# Patient Record
Sex: Female | Born: 1950 | Race: Black or African American | Hispanic: No | Marital: Married | Smoking: Former smoker
Health system: Southern US, Community
[De-identification: ages and names within clinical notes are randomized; demographics above are authoritative.]

## PROBLEM LIST (undated history)

## (undated) DIAGNOSIS — D649 Anemia, unspecified: Secondary | ICD-10-CM

## (undated) DIAGNOSIS — E119 Type 2 diabetes mellitus without complications: Secondary | ICD-10-CM

## (undated) DIAGNOSIS — M1990 Unspecified osteoarthritis, unspecified site: Secondary | ICD-10-CM

## (undated) DIAGNOSIS — N189 Chronic kidney disease, unspecified: Secondary | ICD-10-CM

## (undated) DIAGNOSIS — I10 Essential (primary) hypertension: Secondary | ICD-10-CM

## (undated) DIAGNOSIS — R809 Proteinuria, unspecified: Secondary | ICD-10-CM

## (undated) DIAGNOSIS — Z8711 Personal history of peptic ulcer disease: Secondary | ICD-10-CM

## (undated) DIAGNOSIS — E1110 Type 2 diabetes mellitus with ketoacidosis without coma: Secondary | ICD-10-CM

## (undated) DIAGNOSIS — Z972 Presence of dental prosthetic device (complete) (partial): Secondary | ICD-10-CM

## (undated) DIAGNOSIS — K759 Inflammatory liver disease, unspecified: Secondary | ICD-10-CM

## (undated) HISTORY — PX: TUBAL LIGATION: SHX77

## (undated) HISTORY — PX: DG  BONE DENSITY (ARMC HX): HXRAD1102

## (undated) HISTORY — PX: LAPAROSCOPIC HYSTERECTOMY: SHX1926

## (undated) HISTORY — PX: NEPHROSTOMY W/ INTRODUCTION OF CATHETER: SUR884

---

## 1983-02-14 HISTORY — PX: ABDOMINAL HYSTERECTOMY: SHX81

## 2007-02-14 HISTORY — PX: LIVER BIOPSY: SHX301

## 2007-07-06 ENCOUNTER — Inpatient Hospital Stay: Payer: Self-pay | Admitting: Internal Medicine

## 2007-07-31 ENCOUNTER — Ambulatory Visit: Payer: Self-pay | Admitting: Family Medicine

## 2007-09-12 ENCOUNTER — Ambulatory Visit: Payer: Self-pay | Admitting: Gastroenterology

## 2007-10-29 ENCOUNTER — Ambulatory Visit: Payer: Self-pay | Admitting: Gastroenterology

## 2008-01-16 ENCOUNTER — Ambulatory Visit: Payer: Self-pay | Admitting: Family Medicine

## 2008-05-21 ENCOUNTER — Ambulatory Visit: Payer: Self-pay | Admitting: Family Medicine

## 2008-05-21 DIAGNOSIS — B1920 Unspecified viral hepatitis C without hepatic coma: Secondary | ICD-10-CM | POA: Insufficient documentation

## 2008-05-22 ENCOUNTER — Ambulatory Visit: Payer: Self-pay | Admitting: Family Medicine

## 2009-12-23 DIAGNOSIS — R634 Abnormal weight loss: Secondary | ICD-10-CM | POA: Insufficient documentation

## 2010-06-21 ENCOUNTER — Ambulatory Visit: Payer: Self-pay | Admitting: Otolaryngology

## 2011-01-30 ENCOUNTER — Ambulatory Visit: Payer: Self-pay | Admitting: Gastroenterology

## 2011-02-13 ENCOUNTER — Ambulatory Visit: Payer: Self-pay | Admitting: Gastroenterology

## 2011-02-15 LAB — PATHOLOGY REPORT

## 2011-02-28 ENCOUNTER — Ambulatory Visit: Payer: Self-pay | Admitting: Family Medicine

## 2011-03-06 ENCOUNTER — Ambulatory Visit: Payer: Self-pay | Admitting: Gastroenterology

## 2012-04-26 ENCOUNTER — Emergency Department: Payer: Self-pay | Admitting: Emergency Medicine

## 2012-11-18 DIAGNOSIS — N393 Stress incontinence (female) (male): Secondary | ICD-10-CM | POA: Insufficient documentation

## 2012-11-18 DIAGNOSIS — D179 Benign lipomatous neoplasm, unspecified: Secondary | ICD-10-CM | POA: Insufficient documentation

## 2013-04-15 DIAGNOSIS — N399 Disorder of urinary system, unspecified: Secondary | ICD-10-CM | POA: Insufficient documentation

## 2013-05-26 LAB — LIPID PANEL
Cholesterol: 120 mg/dL (ref 0–200)
HDL: 37 mg/dL (ref 35–70)
LDL CALC: 60 mg/dL
Triglycerides: 116 mg/dL (ref 40–160)

## 2013-05-26 LAB — BASIC METABOLIC PANEL
BUN: 13 mg/dL (ref 4–21)
Creatinine: 0.6 mg/dL (ref 0.5–1.1)
Glucose: 82 mg/dL
Potassium: 4.3 mmol/L (ref 3.4–5.3)
SODIUM: 140 mmol/L (ref 137–147)

## 2013-05-26 LAB — HEPATIC FUNCTION PANEL
ALK PHOS: 50 U/L (ref 25–125)
ALT: 16 U/L (ref 7–35)
AST: 17 U/L (ref 13–35)
BILIRUBIN, TOTAL: 0.3 mg/dL

## 2014-10-15 DEATH — deceased

## 2015-04-06 DIAGNOSIS — R809 Proteinuria, unspecified: Secondary | ICD-10-CM | POA: Diagnosis not present

## 2015-04-06 DIAGNOSIS — E1129 Type 2 diabetes mellitus with other diabetic kidney complication: Secondary | ICD-10-CM | POA: Diagnosis not present

## 2015-04-06 DIAGNOSIS — Z794 Long term (current) use of insulin: Secondary | ICD-10-CM | POA: Diagnosis not present

## 2015-04-13 DIAGNOSIS — Z794 Long term (current) use of insulin: Secondary | ICD-10-CM | POA: Diagnosis not present

## 2015-04-13 DIAGNOSIS — R809 Proteinuria, unspecified: Secondary | ICD-10-CM | POA: Diagnosis not present

## 2015-04-13 DIAGNOSIS — E1129 Type 2 diabetes mellitus with other diabetic kidney complication: Secondary | ICD-10-CM | POA: Diagnosis not present

## 2015-04-13 DIAGNOSIS — E119 Type 2 diabetes mellitus without complications: Secondary | ICD-10-CM | POA: Insufficient documentation

## 2015-04-21 DIAGNOSIS — N2889 Other specified disorders of kidney and ureter: Secondary | ICD-10-CM | POA: Insufficient documentation

## 2015-04-21 DIAGNOSIS — E559 Vitamin D deficiency, unspecified: Secondary | ICD-10-CM | POA: Insufficient documentation

## 2015-04-21 DIAGNOSIS — I10 Essential (primary) hypertension: Secondary | ICD-10-CM | POA: Insufficient documentation

## 2015-04-21 DIAGNOSIS — K635 Polyp of colon: Secondary | ICD-10-CM | POA: Insufficient documentation

## 2015-04-21 DIAGNOSIS — R160 Hepatomegaly, not elsewhere classified: Secondary | ICD-10-CM | POA: Insufficient documentation

## 2015-04-23 ENCOUNTER — Ambulatory Visit (INDEPENDENT_AMBULATORY_CARE_PROVIDER_SITE_OTHER): Payer: Medicare Other | Admitting: Physician Assistant

## 2015-04-23 ENCOUNTER — Encounter: Payer: Self-pay | Admitting: Physician Assistant

## 2015-04-23 VITALS — BP 126/78 | HR 76 | Temp 98.1°F | Resp 18 | Ht 68.0 in | Wt 195.0 lb

## 2015-04-23 DIAGNOSIS — Z23 Encounter for immunization: Secondary | ICD-10-CM

## 2015-04-23 DIAGNOSIS — Z0000 Encounter for general adult medical examination without abnormal findings: Secondary | ICD-10-CM | POA: Diagnosis not present

## 2015-04-23 DIAGNOSIS — Z1239 Encounter for other screening for malignant neoplasm of breast: Secondary | ICD-10-CM | POA: Diagnosis not present

## 2015-04-23 NOTE — Patient Instructions (Signed)

## 2015-04-23 NOTE — Progress Notes (Signed)
Patient: Julie [name], Female    DOB: [birth year], 65 y.o.   MRN: [id number] Visit Date: 04/23/2015  Today's Provider: Mar Daring, PA-C   Chief Complaint  Patient presents with  . Medicare Wellness   Subjective:    Annual wellness visit Julie [name] is a 65 y.o. female. She feels well. She reports exercising not really any physical exercise-she stays active and tries to walk. She reports she is sleeping well. She followed by [facility] for her Diabetes Type 2. LOV was 04/20/14 HgbA1C was 8.0. Was advised to continue Levemir,Metformin, Glipizide, Farxiga and Januvia 100 mg daily was added. She was to follow up with them in three months-last visit with Dr. Gabriel Carina was in February 2017.  Immunization History  Administered Date(s) Administered  . Hepatitis B 10/26/2008, 11/25/2008, 11/30/2010  . Pneumococcal Polysaccharide-23 11/19/2007  . Tdap 11/30/2010   Colonoscopy: 02/13/11-Single polyp. Pathology report recommend repeat colonoscopy in 5 years.  This was down by Dr. Gustavo Lah. Echo: 08/2008 Heart Murmur Mild MR, Mild TR, Normal EF 60%. Urology Consult. 11/28/12 Dr. Bernardo Heater. Right renal angiomyolipoma. Ct repeated on 11/10/2013 showed stable size at less than 4 cm. Follow up renal Ultrasound in 1 yr.  She is followed by Dr. Bernardo Heater and sees him in 05/2015. Mammogram:2013 patient is not sure but has not had one in the past 2 years. Pap smear-she is not sure how long but she had complete hysterectomy, she has no female parts left.   Review of Systems  Constitutional: Negative.   HENT: Negative.   Eyes: Negative.   Respiratory: Negative.   Cardiovascular: Negative.   Gastrointestinal: Negative.   Endocrine: Negative.   Genitourinary: Negative.   Musculoskeletal: Negative.   Skin: Negative.   Allergic/Immunologic: Negative.   Neurological: Negative.   Hematological: Negative.   Psychiatric/Behavioral: Negative.     Social History   Social History  . Marital  Status: Legally Separated    Spouse Name: N/A  . Number of Children: N/A  . Years of Education: N/A   Occupational History  . Not on file.   Social History Main Topics  . Smoking status: Former Smoker -- 0.50 packs/day for 20 years    Types: Cigarettes    Quit date: 02/13/2005  . Smokeless tobacco: Never Used  . Alcohol Use: No  . Drug Use: No  . Sexual Activity: No   Other Topics Concern  . Not on file   Social History Narrative    No past medical history on file.   Patient Active Problem List   Diagnosis Date Noted  . Colon polyp 04/21/2015  . Benign essential HTN 04/21/2015  . Liver mass 04/21/2015  . Kidney lump 04/21/2015  . Avitaminosis D 04/21/2015  . Type 2 diabetes mellitus ([place]) 04/13/2015  . Urinary system disease 04/15/2013  . Fatty tumor 11/18/2012  . Abnormal loss of weight 12/23/2009  . Pure hypercholesterolemia 01/26/2009  . Hepatitis C virus infection without hepatic coma 05/21/2008  . Diabetes mellitus ([place]) 07/12/2007  . Abnormal ECG 02/09/2007  . H/O peptic ulcer 02/09/2007  . Tobacco use 02/01/2007    Past Surgical History  Procedure Laterality Date  . Liver biopsy  2009    + hepatitis C, referred to [facility] by Gustavo Lah 04/2008.  . Laparoscopic hysterectomy      assisted vaginal hysterectomy, fibroids, ovaries removed    Her family history includes Alcohol abuse in her father; Arthritis in her mother; Cancer in her maternal aunt; Cirrhosis in her father;  Diabetes in her son; Stroke in her father.    Previous Medications   BLOOD GLUCOSE MONITORING SUPPL (ONETOUCH VERIO) W/DEVICE KIT       DAPAGLIFLOZIN PROPANEDIOL (FARXIGA) 10 MG TABS TABLET    Take by mouth.   ENALAPRIL (VASOTEC) 20 MG TABLET    Take by mouth.   GLIPIZIDE (GLUCOTROL XL) 10 MG 24 HR TABLET    Take by mouth.   GLUCOSE BLOOD (ONETOUCH VERIO) TEST STRIP       INSULIN DETEMIR (LEVEMIR FLEXPEN) 100 UNIT/ML PEN    Inject 48 Units into the skin at bedtime.    INSULIN PEN NEEDLE  (BD PEN NEEDLE NANO U/F) 32G X 4 MM MISC       JANUVIA 100 MG TABLET       LOVASTATIN (MEVACOR) 40 MG TABLET    Take by mouth. Reported on 04/23/2015   METFORMIN (GLUCOPHAGE) 500 MG TABLET    Take by mouth.    Patient Care Team: Mar Daring, PA-C as PCP - General (Family Medicine)     Objective:   Vitals: BP 126/78 mmHg  Pulse 76  Temp(Src) 98.1 F (36.7 C)  Resp 18  Ht '5\' 8"'  (1.727 m)  Wt 195 lb (88.451 kg)  BMI 29.66 kg/m2  Physical Exam  Constitutional: She is oriented to person, place, and time. She appears well-developed and well-nourished. No distress.  HENT:  Head: Normocephalic and atraumatic.  Right Ear: Hearing, tympanic membrane, external ear and ear canal normal.  Left Ear: Hearing, tympanic membrane, external ear and ear canal normal.  Nose: Nose normal.  Mouth/Throat: Uvula is midline, oropharynx is clear and moist and mucous membranes are normal. No oropharyngeal exudate, posterior oropharyngeal edema or posterior oropharyngeal erythema.  Eyes: Conjunctivae and EOM are normal. Pupils are equal, round, and reactive to light. Right eye exhibits no discharge. Left eye exhibits no discharge. No scleral icterus.  Neck: Normal range of motion. Neck supple. No JVD present. No tracheal deviation present. No thyromegaly present.  Cardiovascular: Normal rate, regular rhythm, normal heart sounds, intact distal pulses and normal pulses.  Exam reveals no gallop and no friction rub.   No murmur heard. Pulses:      Dorsalis pedis pulses are 2+ on the right side, and 2+ on the left side.  Pulmonary/Chest: Effort normal and breath sounds normal. No respiratory distress. She has no wheezes. She has no rales. She exhibits no tenderness.  Abdominal: Soft. Bowel sounds are normal. She exhibits no distension and no mass. There is no tenderness. There is no rebound and no guarding.  Genitourinary:  Deferred by patient  Musculoskeletal: Normal range of motion. She exhibits no  edema or tenderness.  Lymphadenopathy:    She has no cervical adenopathy.  Neurological: She is alert and oriented to person, place, and time. No cranial nerve deficit. Coordination normal.  Skin: Skin is warm and dry. No rash noted. She is not diaphoretic.  Psychiatric: She has a normal mood and affect. Her behavior is normal. Judgment and thought content normal.  Vitals reviewed.   Activities of Daily Living In your present state of health, do you have any difficulty performing the following activities: 04/23/2015  Hearing? N  Vision? N  Difficulty concentrating or making decisions? N  Walking or climbing stairs? N  Dressing or bathing? N  Doing errands, shopping? N    Fall Risk Assessment Fall Risk  04/23/2015  Falls in the past year? No     Depression Screen PHQ 2/9  Scores 04/23/2015  PHQ - 2 Score 0    Cognitive Testing - 6-CIT  Correct? Score   What year is it? yes 0 0 or 4  What month is it? yes 0 0 or 3  Memorize:    Pia Mau,  41,  [place],      What time is it? (within 1 hour) yes 0 0 or 3  Count backwards from 20 yes 0 0, 2, or 4  Name the months of the year yes 0 0, 2, or 4  Repeat name & address above no 3 0, 2, 4, 6, 8, or 10       TOTAL SCORE  3/28   Interpretation:  Normal  Normal (0-7) Abnormal (8-28)   Audit-C Alcohol Use Screening  Question Answer Points  How often do you have alcoholic drink? never 0  On days you do drink alcohol, how many drinks do you typically consume? 0 0  How oftey will you drink 6 or more in a total? never 0  Total Score:  0   A score of 3 or more in women, and 4 or more in men indicates increased risk for alcohol abuse, EXCEPT if all of the points are from question 1.   Hearing Screening   '125Hz'  '250Hz'  '500Hz'  '1000Hz'  '2000Hz'  '4000Hz'  '8000Hz'   Right ear:   '20 25 20 25   ' Left ear:   '20 25 20 25         ' Assessment & Plan:     Annual Wellness Visit  Reviewed patient's Family Medical History Reviewed and  updated list of patient's medical providers Assessment of cognitive impairment was done Assessed patient's functional ability Established a written schedule for health screening [place] Completed and Reviewed   Discussed health benefits of physical activity, and encouraged her to engage in regular exercise appropriate for her age and condition.    ------------------------------------------------------------------------------------------------

## 2015-05-21 ENCOUNTER — Other Ambulatory Visit: Payer: Self-pay | Admitting: Physician Assistant

## 2015-05-21 ENCOUNTER — Ambulatory Visit
Admission: RE | Admit: 2015-05-21 | Discharge: 2015-05-21 | Disposition: A | Discharge status: Home / Self Care | Payer: Medicare Other | Source: Ambulatory Visit | Attending: Physician Assistant | Admitting: Physician Assistant

## 2015-05-21 DIAGNOSIS — Z1239 Encounter for other screening for malignant neoplasm of breast: Secondary | ICD-10-CM

## 2015-05-21 DIAGNOSIS — Z1231 Encounter for screening mammogram for malignant neoplasm of breast: Secondary | ICD-10-CM | POA: Diagnosis present

## 2015-05-24 DIAGNOSIS — D179 Benign lipomatous neoplasm, unspecified: Secondary | ICD-10-CM | POA: Diagnosis not present

## 2015-05-24 DIAGNOSIS — Z6832 Body mass index (BMI) 32.0-32.9, adult: Secondary | ICD-10-CM | POA: Diagnosis not present

## 2015-05-24 DIAGNOSIS — N2889 Other specified disorders of kidney and ureter: Secondary | ICD-10-CM | POA: Diagnosis not present

## 2015-05-25 ENCOUNTER — Other Ambulatory Visit: Payer: Self-pay | Admitting: Physician Assistant

## 2015-05-25 DIAGNOSIS — R928 Other abnormal and inconclusive findings on diagnostic imaging of breast: Secondary | ICD-10-CM

## 2015-05-27 DIAGNOSIS — N281 Cyst of kidney, acquired: Secondary | ICD-10-CM | POA: Diagnosis not present

## 2015-05-27 DIAGNOSIS — D1771 Benign lipomatous neoplasm of kidney: Secondary | ICD-10-CM | POA: Diagnosis not present

## 2015-06-11 ENCOUNTER — Telehealth: Payer: Self-pay

## 2015-06-11 ENCOUNTER — Ambulatory Visit
Admission: RE | Admit: 2015-06-11 | Discharge: 2015-06-11 | Disposition: A | Discharge status: Home / Self Care | Payer: Medicare Other | Source: Ambulatory Visit | Attending: Physician Assistant | Admitting: Physician Assistant

## 2015-06-11 DIAGNOSIS — R928 Other abnormal and inconclusive findings on diagnostic imaging of breast: Secondary | ICD-10-CM | POA: Diagnosis present

## 2015-06-11 DIAGNOSIS — N6489 Other specified disorders of breast: Secondary | ICD-10-CM | POA: Diagnosis not present

## 2015-06-11 NOTE — Telephone Encounter (Signed)
-----   Message from Mar Daring, [place] sent at 06/11/2015 10:38 AM EDT ----- Mass still noted in left breast that is believed to be benign but recommend 6 month repeat imaging of left breast.

## 2015-06-11 NOTE — Telephone Encounter (Signed)
Patient advised as directed below.  Thanks,  -Joseline 

## 2015-06-11 NOTE — Telephone Encounter (Signed)
LMTCB  Thanks,  -Collene Massimino 

## 2015-06-11 NOTE — Telephone Encounter (Signed)
Pt is returning call.  CB#[phone]/MW

## 2015-08-04 DIAGNOSIS — R809 Proteinuria, unspecified: Secondary | ICD-10-CM | POA: Diagnosis not present

## 2015-08-04 DIAGNOSIS — E1129 Type 2 diabetes mellitus with other diabetic kidney complication: Secondary | ICD-10-CM | POA: Diagnosis not present

## 2015-08-04 DIAGNOSIS — Z794 Long term (current) use of insulin: Secondary | ICD-10-CM | POA: Diagnosis not present

## 2015-08-04 LAB — HEPATIC FUNCTION PANEL
ALK PHOS: 41 U/L (ref 25–125)
ALT: 11 U/L (ref 7–35)
AST: 15 U/L (ref 13–35)

## 2015-08-11 DIAGNOSIS — E1129 Type 2 diabetes mellitus with other diabetic kidney complication: Secondary | ICD-10-CM | POA: Diagnosis not present

## 2015-08-11 DIAGNOSIS — Z794 Long term (current) use of insulin: Secondary | ICD-10-CM | POA: Diagnosis not present

## 2015-08-11 DIAGNOSIS — R809 Proteinuria, unspecified: Secondary | ICD-10-CM | POA: Diagnosis not present

## 2015-12-06 DIAGNOSIS — Z794 Long term (current) use of insulin: Secondary | ICD-10-CM | POA: Diagnosis not present

## 2015-12-06 DIAGNOSIS — R809 Proteinuria, unspecified: Secondary | ICD-10-CM | POA: Diagnosis not present

## 2015-12-06 DIAGNOSIS — E1129 Type 2 diabetes mellitus with other diabetic kidney complication: Secondary | ICD-10-CM | POA: Diagnosis not present

## 2015-12-06 LAB — BASIC METABOLIC PANEL
BUN: 16 mg/dL (ref 4–21)
CREATININE: 0.6 mg/dL (ref 0.5–1.1)
Glucose: 104 mg/dL
POTASSIUM: 4.1 mmol/L (ref 3.4–5.3)
SODIUM: 141 mmol/L (ref 137–147)

## 2015-12-06 LAB — LIPID PANEL
Cholesterol: 164 mg/dL (ref 0–200)
HDL: 36 mg/dL (ref 35–70)
LDL Cholesterol: 103 mg/dL
Triglycerides: 124 mg/dL (ref 40–160)

## 2015-12-13 DIAGNOSIS — R809 Proteinuria, unspecified: Secondary | ICD-10-CM | POA: Diagnosis not present

## 2015-12-13 DIAGNOSIS — Z794 Long term (current) use of insulin: Secondary | ICD-10-CM | POA: Diagnosis not present

## 2015-12-13 DIAGNOSIS — E1129 Type 2 diabetes mellitus with other diabetic kidney complication: Secondary | ICD-10-CM | POA: Diagnosis not present

## 2015-12-24 ENCOUNTER — Telehealth: Payer: Self-pay | Admitting: Physician Assistant

## 2015-12-24 DIAGNOSIS — R928 Other abnormal and inconclusive findings on diagnostic imaging of breast: Secondary | ICD-10-CM

## 2015-12-24 NOTE — Telephone Encounter (Signed)
Please Review.  Thanks,  -Caid Radin 

## 2015-12-24 NOTE — Telephone Encounter (Signed)
Mammogram ordered

## 2015-12-24 NOTE — Telephone Encounter (Signed)
Pt states she is needing an appointment with [facility] to have a 6 month follow up.  Pt states we will need to send an order to [facility] so she can have this scheduled.  CB#[phone]/MW

## 2015-12-24 NOTE — Telephone Encounter (Signed)
Patient has been advised. KW 

## 2016-01-25 ENCOUNTER — Telehealth: Payer: Self-pay | Admitting: Physician Assistant

## 2016-01-25 DIAGNOSIS — R928 Other abnormal and inconclusive findings on diagnostic imaging of breast: Secondary | ICD-10-CM

## 2016-01-25 NOTE — Telephone Encounter (Signed)
Pt stated that it is time for 6 month Dx mammogram and she called [facility] and was advised that [name] would need to send a referral/order for the [place]. Please advise. Thanks TNP

## 2016-01-25 NOTE — Telephone Encounter (Signed)
Please review.  Thanks,  -Joseline 

## 2016-01-25 NOTE — Telephone Encounter (Signed)
Diagnostic mammogram and left breast US ordered

## 2016-02-18 ENCOUNTER — Ambulatory Visit
Admission: RE | Admit: 2016-02-18 | Discharge: 2016-02-18 | Disposition: A | Discharge status: Home / Self Care | Payer: Medicare Other | Source: Ambulatory Visit | Attending: Physician Assistant | Admitting: Physician Assistant

## 2016-02-18 ENCOUNTER — Other Ambulatory Visit: Payer: Self-pay | Admitting: Physician Assistant

## 2016-02-18 ENCOUNTER — Telehealth: Payer: Self-pay

## 2016-02-18 DIAGNOSIS — R928 Other abnormal and inconclusive findings on diagnostic imaging of breast: Secondary | ICD-10-CM | POA: Diagnosis present

## 2016-02-18 DIAGNOSIS — N6321 Unspecified lump in the left breast, upper outer quadrant: Secondary | ICD-10-CM | POA: Insufficient documentation

## 2016-02-18 NOTE — Telephone Encounter (Signed)
-----   Message from Mar Daring, [place] sent at 02/18/2016 10:55 AM EST ----- Left breast mass stable, seemingly benign and plan for repeat diagnostic mammogram and [place] in 05/2016

## 2016-02-18 NOTE — Telephone Encounter (Signed)
Pt advised and agrees with treatment plan. Emily Drozdowski, CMA  

## 2016-04-07 DIAGNOSIS — Z86010 Personal history of colonic polyps: Secondary | ICD-10-CM | POA: Diagnosis not present

## 2016-04-07 DIAGNOSIS — Z794 Long term (current) use of insulin: Secondary | ICD-10-CM | POA: Diagnosis not present

## 2016-04-07 DIAGNOSIS — E1129 Type 2 diabetes mellitus with other diabetic kidney complication: Secondary | ICD-10-CM | POA: Diagnosis not present

## 2016-04-07 DIAGNOSIS — R809 Proteinuria, unspecified: Secondary | ICD-10-CM | POA: Diagnosis not present

## 2016-04-07 LAB — HEMOGLOBIN A1C: Hemoglobin A1C: 7

## 2016-04-14 DIAGNOSIS — R809 Proteinuria, unspecified: Secondary | ICD-10-CM | POA: Diagnosis not present

## 2016-04-14 DIAGNOSIS — Z794 Long term (current) use of insulin: Secondary | ICD-10-CM | POA: Diagnosis not present

## 2016-04-14 DIAGNOSIS — E1129 Type 2 diabetes mellitus with other diabetic kidney complication: Secondary | ICD-10-CM | POA: Diagnosis not present

## 2016-07-13 ENCOUNTER — Ambulatory Visit (INDEPENDENT_AMBULATORY_CARE_PROVIDER_SITE_OTHER): Payer: Medicare Other | Admitting: Physician Assistant

## 2016-07-13 ENCOUNTER — Ambulatory Visit
Admission: RE | Admit: 2016-07-13 | Discharge: 2016-07-13 | Disposition: A | Discharge status: Home / Self Care | Payer: Medicare Other | Source: Ambulatory Visit | Attending: Physician Assistant | Admitting: Physician Assistant

## 2016-07-13 ENCOUNTER — Encounter: Payer: Self-pay | Admitting: Physician Assistant

## 2016-07-13 ENCOUNTER — Telehealth: Payer: Self-pay

## 2016-07-13 VITALS — BP 106/40 | HR 84 | Temp 97.8°F | Resp 16 | Ht 65.0 in | Wt 198.0 lb

## 2016-07-13 DIAGNOSIS — M25511 Pain in right shoulder: Secondary | ICD-10-CM | POA: Diagnosis not present

## 2016-07-13 DIAGNOSIS — M19011 Primary osteoarthritis, right shoulder: Secondary | ICD-10-CM | POA: Diagnosis not present

## 2016-07-13 DIAGNOSIS — M7581 Other shoulder lesions, right shoulder: Secondary | ICD-10-CM

## 2016-07-13 DIAGNOSIS — R928 Other abnormal and inconclusive findings on diagnostic imaging of breast: Secondary | ICD-10-CM

## 2016-07-13 DIAGNOSIS — M7989 Other specified soft tissue disorders: Secondary | ICD-10-CM

## 2016-07-13 DIAGNOSIS — R2232 Localized swelling, mass and lump, left upper limb: Secondary | ICD-10-CM

## 2016-07-13 MED ORDER — NAPROXEN 500 MG PO TABS: 500.0000 mg | ORAL_TABLET | Freq: Two times a day (BID) | ORAL | 0 refills | Status: DC

## 2016-07-13 NOTE — Addendum Note (Signed)
Addended by: Mar Daring on: 07/13/2016 03:16 PM   Modules accepted: Orders

## 2016-07-13 NOTE — Telephone Encounter (Signed)
LMTCB  Thanks,  -Joseline 

## 2016-07-13 NOTE — Telephone Encounter (Signed)
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

## 2016-07-13 NOTE — Progress Notes (Signed)
Patient: Julie [name] Female    DOB: 1950/06/03   66 y.o.   MRN: [id number] Visit Date: 07/13/2016  Today's Provider: Mar Daring, PA-C   Chief Complaint  Patient presents with  . Shoulder Pain  . Adenopathy   Subjective:    Shoulder Pain   The pain is present in the right shoulder. This is a new problem. Episode onset: x 3 months. The problem has been unchanged. Quality: "dull, nagging" The pain is at a severity of 4/10. The pain is moderate. Associated symptoms include a limited range of motion. Pertinent negatives include no fever, inability to bear weight, itching, joint locking, joint swelling, numbness, stiffness or tingling. Exacerbated by: movement. She has tried NSAIDS (topical ointment) for the symptoms. The treatment provided significant relief.    Pt also notes left axillary mass. This has been for several years (approx 10 per patient), but has been worsening, becoming more tender and enlarging.    Pt also needs a referral for bilateral diagnostic mammogram and left breast ultrasound (which was due in April 2018) for a FU of a BI-RADS 3 for left breast abnormality mammogram on 02/18/2016.   No Known Allergies   Current Outpatient Prescriptions:  .  Blood Glucose Monitoring Suppl (ONETOUCH VERIO) w/Device KIT, , Disp: , Rfl:  .  enalapril (VASOTEC) 20 MG tablet, Take 40 mg by mouth daily. , Disp: , Rfl:  .  glipiZIDE (GLUCOTROL XL) 10 MG 24 hr tablet, Take 10 mg by mouth daily with breakfast. , Disp: , Rfl:  .  glucose blood (ONETOUCH VERIO) test strip, , Disp: , Rfl:  .  Insulin Detemir (LEVEMIR FLEXPEN) 100 UNIT/ML Pen, Inject 48 Units into the skin at bedtime. , Disp: , Rfl:  .  Insulin Pen Needle (BD PEN NEEDLE NANO U/F) 32G X 4 MM MISC, , Disp: , Rfl:  .  JANUVIA 100 MG tablet, , Disp: , Rfl:  .  metFORMIN (GLUCOPHAGE) 500 MG tablet, Take by mouth 2 (two) times daily with a meal. , Disp: , Rfl:   Review of Systems  Constitutional: Negative for  activity change, appetite change, chills, diaphoresis, fatigue, fever and unexpected weight change.  Respiratory: Negative for cough and shortness of breath.   Cardiovascular: Negative for chest pain, palpitations and leg swelling.  Musculoskeletal: Positive for arthralgias. Negative for joint swelling, neck pain, neck stiffness and stiffness.  Skin: Negative for itching.  Neurological: Negative for tingling, weakness and numbness.  Hematological: Positive for adenopathy (mass left armpit).    Social History  Substance Use Topics  . Smoking status: Former Smoker    Packs/day: 0.50    Years: 20.00    Types: Cigarettes    Quit date: 02/13/2005  . Smokeless tobacco: Never Used  . Alcohol use No   Objective:   BP (!) 106/40 (BP Location: Left Arm, Patient Position: Sitting, Cuff Size: Large)   Pulse 84   Temp 97.8 F (36.6 C) (Oral)   Resp 16   Ht 5' 5" (1.651 m)   Wt 198 lb (89.8 kg)   BMI 32.95 kg/m  Vitals:   07/13/16 1044  BP: (!) 106/40  Pulse: 84  Resp: 16  Temp: 97.8 F (36.6 C)  TempSrc: Oral  Weight: 198 lb (89.8 kg)  Height: 5' 5" (1.651 m)     Physical Exam  Constitutional: She appears well-developed and well-nourished. No distress.  Neck: Normal range of motion. Neck supple. No JVD present. No spinous process  tenderness and no muscular tenderness present. No tracheal deviation and normal range of motion present. No thyromegaly present.  Cardiovascular: Normal rate, regular rhythm and normal heart sounds.  Exam reveals no gallop and no friction rub.   No murmur heard. Pulmonary/Chest: Effort normal and breath sounds normal. No respiratory distress. She has no wheezes. She has no rales. She exhibits mass.    Musculoskeletal:       Right shoulder: She exhibits decreased range of motion (IR and ER at 90 degree abd, and IR with arm at side). She exhibits no tenderness, no bony tenderness, no swelling, no crepitus, no deformity, no pain, no spasm, normal pulse and  normal strength.  + Apley scratch, negative Hawkin's impingement, negative empty can  Lymphadenopathy:    She has no cervical adenopathy.  Skin: She is not diaphoretic.  Vitals reviewed.      Assessment & Plan:     1. Rotator cuff tendinitis, right Suspect a rotator cuff tendinopathy vs arthritis (or both). Will get imaging as below and f/u pending results. May continue topical creams. Heat as needed. Exercises provided via AVS. Will give naproxen as below for longer and more consistent trial of NSAIDs. Will avoid steroids if possible due to T2DM. Will f/u pending results.  - naproxen (NAPROSYN) 500 MG tablet; Take 1 tablet (500 mg total) by mouth 2 (two) times daily with a meal.  Dispense: 30 tablet; Refill: 0 - DG Shoulder Right; Future  2. Acute pain of right shoulder See above medical treatment plan. - DG Shoulder Right; Future  3. Abnormal mammogram of left breast Abnormal diagnostic mammogram of left breast. Repeat imaging ordered. - MM DIAG BREAST TOMO BILATERAL; Future - US BREAST COMPLETE UNI LEFT INC AXILLA; Future  4. Mass of soft tissue of left upper extremity Suspect lipoma, but US ordered as below to further examine. Will f/u pending results.  - US AXILLA LEFT; Future       Jennifer M Burnette, PA-C  [facility] 

## 2016-07-13 NOTE — Patient Instructions (Signed)

## 2016-07-14 NOTE — Telephone Encounter (Signed)
Pt advised-aa 

## 2016-07-20 ENCOUNTER — Emergency Department
Admission: EM | Admit: 2016-07-20 | Discharge: 2016-07-20 | Disposition: A | Discharge status: Home / Self Care | Payer: Medicare Other | Attending: Emergency Medicine | Admitting: Emergency Medicine

## 2016-07-20 ENCOUNTER — Encounter: Payer: Self-pay | Admitting: Emergency Medicine

## 2016-07-20 DIAGNOSIS — Z87891 Personal history of nicotine dependence: Secondary | ICD-10-CM | POA: Diagnosis not present

## 2016-07-20 DIAGNOSIS — Z7984 Long term (current) use of oral hypoglycemic drugs: Secondary | ICD-10-CM | POA: Diagnosis not present

## 2016-07-20 DIAGNOSIS — E119 Type 2 diabetes mellitus without complications: Secondary | ICD-10-CM | POA: Diagnosis not present

## 2016-07-20 DIAGNOSIS — N12 Tubulo-interstitial nephritis, not specified as acute or chronic: Secondary | ICD-10-CM

## 2016-07-20 DIAGNOSIS — Z794 Long term (current) use of insulin: Secondary | ICD-10-CM | POA: Diagnosis not present

## 2016-07-20 DIAGNOSIS — N10 Acute tubulo-interstitial nephritis: Secondary | ICD-10-CM | POA: Insufficient documentation

## 2016-07-20 DIAGNOSIS — I10 Essential (primary) hypertension: Secondary | ICD-10-CM | POA: Insufficient documentation

## 2016-07-20 DIAGNOSIS — Z79899 Other long term (current) drug therapy: Secondary | ICD-10-CM | POA: Diagnosis not present

## 2016-07-20 DIAGNOSIS — R1084 Generalized abdominal pain: Secondary | ICD-10-CM | POA: Diagnosis present

## 2016-07-20 LAB — CBC
HCT: 30.2 % — ABNORMAL LOW (ref 35.0–47.0)
Hemoglobin: 10 g/dL — ABNORMAL LOW (ref 12.0–16.0)
MCH: 29 pg (ref 26.0–34.0)
MCHC: 33.2 g/dL (ref 32.0–36.0)
MCV: 87.4 fL (ref 80.0–100.0)
PLATELETS: 138 10*3/uL — AB (ref 150–440)
RBC: 3.46 MIL/uL — AB (ref 3.80–5.20)
RDW: 13.4 % (ref 11.5–14.5)
WBC: 12 10*3/uL — ABNORMAL HIGH (ref 3.6–11.0)

## 2016-07-20 LAB — URINALYSIS, COMPLETE (UACMP) WITH MICROSCOPIC
Bilirubin Urine: NEGATIVE
Glucose, UA: NEGATIVE mg/dL
Hgb urine dipstick: NEGATIVE
KETONES UR: NEGATIVE mg/dL
Nitrite: POSITIVE — AB
PH: 6 (ref 5.0–8.0)
Protein, ur: NEGATIVE mg/dL
Specific Gravity, Urine: 1.012 (ref 1.005–1.030)

## 2016-07-20 LAB — COMPREHENSIVE METABOLIC PANEL
ALBUMIN: 4 g/dL (ref 3.5–5.0)
ALT: 15 U/L (ref 14–54)
AST: 20 U/L (ref 15–41)
Alkaline Phosphatase: 49 U/L (ref 38–126)
Anion gap: 9 (ref 5–15)
BUN: 22 mg/dL — AB (ref 6–20)
CHLORIDE: 102 mmol/L (ref 101–111)
CO2: 25 mmol/L (ref 22–32)
CREATININE: 0.72 mg/dL (ref 0.44–1.00)
Calcium: 9.3 mg/dL (ref 8.9–10.3)
GFR calc Af Amer: >60 mL/min (ref >60)
GFR calc non Af Amer: >60 mL/min (ref >60)
Glucose, Bld: 155 mg/dL — ABNORMAL HIGH (ref 65–99)
Potassium: 4.3 mmol/L (ref 3.5–5.1)
SODIUM: 136 mmol/L (ref 135–145)
Total Bilirubin: 0.7 mg/dL (ref 0.3–1.2)
Total Protein: 7.7 g/dL (ref 6.5–8.1)

## 2016-07-20 LAB — LIPASE, BLOOD: LIPASE: 61 U/L — AB (ref 11–51)

## 2016-07-20 MED ORDER — ONDANSETRON HCL 4 MG PO TABS: 4.0000 mg | ORAL_TABLET | Freq: Three times a day (TID) | ORAL | 0 refills | Status: DC | PRN

## 2016-07-20 MED ORDER — DEXTROSE 5 % IV SOLN
1.0000 g | Freq: Once | INTRAVENOUS | Status: AC
  Administered 2016-07-20: 1 g via INTRAVENOUS
  Filled 2016-07-20: qty 10

## 2016-07-20 MED ORDER — CEPHALEXIN 500 MG PO CAPS
500.0000 mg | ORAL_CAPSULE | Freq: Three times a day (TID) | ORAL | 0 refills | Status: AC
Start: 2016-07-20 — End: 2016-07-30

## 2016-07-20 MED ORDER — SODIUM CHLORIDE 0.9 % IV BOLUS (SEPSIS)
1000.0000 mL | Freq: Once | INTRAVENOUS | Status: AC
  Administered 2016-07-20: 1000 mL via INTRAVENOUS

## 2016-07-20 NOTE — ED Provider Notes (Addendum)
[facility] Emergency Department Provider Note  ____________________________________________   I have reviewed the triage vital signs and the nursing notes.   HISTORY  Chief Complaint Flank Pain and Abdominal Pain    HPI Julie [name] is a 66 y.o. female who states she's been having urinary frequency, urgency, foul smelling urine which is cloudy for the last 2-3 days and now she is beginning to have some pain on the left flank. She denies vomiting or fever. No other associated symptoms no prior treatment, nothing makes it better nothing makes it worse. Pain is a mild cramping discomfort.      History reviewed. No pertinent past medical history.  Patient Active Problem List   Diagnosis Date Noted  . Colon polyp 04/21/2015  . Benign essential HTN 04/21/2015  . Liver mass 04/21/2015  . Kidney lump 04/21/2015  . Avitaminosis D 04/21/2015  . Type 2 diabetes mellitus ([place]) 04/13/2015  . Urinary system disease 04/15/2013  . Fatty tumor 11/18/2012  . Abnormal loss of weight 12/23/2009  . Pure hypercholesterolemia 01/26/2009  . Hepatitis C virus infection without hepatic coma 05/21/2008  . Diabetes mellitus ([place]) 07/12/2007  . Abnormal ECG 02/09/2007  . H/O peptic ulcer 02/09/2007  . Tobacco use 02/01/2007    Past Surgical History:  Procedure Laterality Date  . LAPAROSCOPIC HYSTERECTOMY     assisted vaginal hysterectomy, fibroids, ovaries removed  . LIVER BIOPSY  2009   + hepatitis C, referred to [facility] by Gustavo Lah 04/2008.    Prior to Admission medications   Medication Sig Start Date End Date Taking? Authorizing Provider  Blood Glucose Monitoring Suppl (ONETOUCH VERIO) w/Device KIT  04/13/15   [provider]  enalapril (VASOTEC) 20 MG tablet Take 40 mg by mouth daily.  07/15/13   [provider]  glipiZIDE (GLUCOTROL XL) 10 MG 24 hr tablet Take 10 mg by mouth daily with breakfast.  01/03/12   [provider]  glucose blood  (ONETOUCH VERIO) test strip  06/04/12   [provider]  Insulin Detemir (LEVEMIR FLEXPEN) 100 UNIT/ML Pen Inject 48 Units into the skin at bedtime.     [provider]  Insulin Pen Needle (BD PEN NEEDLE NANO U/F) 32G X 4 MM MISC  05/28/14   [provider]  JANUVIA 100 MG tablet  02/16/15   [provider]  metFORMIN (GLUCOPHAGE) 500 MG tablet Take by mouth 2 (two) times daily with a meal.  11/16/11   [provider]  naproxen (NAPROSYN) 500 MG tablet Take 1 tablet (500 mg total) by mouth 2 (two) times daily with a meal. 07/13/16   Mar Daring, PA-C    Allergies Patient has no known allergies.  Family History  Problem Relation Age of Onset  . Arthritis Mother   . Hypertension Mother   . Stroke Father   . Alcohol abuse Father   . Cirrhosis Father   . Cancer Maternal Aunt   . Diabetes Son   . Pancreatic disease Brother   . Healthy Brother   . Healthy Brother   . Breast cancer Neg Hx     Social History Social History  Substance Use Topics  . Smoking status: Former Smoker    Packs/day: 0.50    Years: 20.00    Types: Cigarettes    Quit date: 02/13/2005  . Smokeless tobacco: Never Used  . Alcohol use No    Review of Systems Constitutional: No fever/chills Eyes: No visual changes. ENT: No sore throat. No stiff  neck no neck pain Cardiovascular: Denies chest pain. Respiratory: Denies shortness of breath. Gastrointestinal:   no vomiting.  No diarrhea.  No constipation. Genitourinary: See history of present illness Musculoskeletal: Negative lower extremity swelling Skin: Negative for rash. Neurological: Negative for severe headaches, focal weakness or numbness.   ____________________________________________   PHYSICAL EXAM:  VITAL SIGNS: ED Triage Vitals  Enc Vitals Group     BP 07/20/16 0834 (!) 149/81     Pulse Rate 07/20/16 0834 88     Resp 07/20/16 0834 20     Temp 07/20/16 0834 98.7 F (37.1 C)     Temp Source  07/20/16 0834 Oral     SpO2 07/20/16 0834 98 %     Weight 07/20/16 0835 198 lb (89.8 kg)     Height 07/20/16 0835 '5\' 5"'  (1.651 m)     Head Circumference --      Peak Flow --      Pain Score 07/20/16 0844 2     Pain Loc --      Pain Edu? --      Excl. in [place]? --     Constitutional: Alert and oriented. Well appearing and in no acute distress. Eyes: Conjunctivae are normal Head: Atraumatic HEENT: No congestion/rhinnorhea. Mucous membranes are moist.  Oropharynx non-erythematous Neck:   Nontender with no meningismus, no masses, no stridor Cardiovascular: Normal rate, regular rhythm. Grossly normal heart sounds.  Good peripheral circulation. Respiratory: Normal respiratory effort.  No retractions. Lungs CTAB. Abdominal: Soft and very minimal tenderness noted to the left lower quadrant. No distention. No guarding no rebound Back:  There is no focal tenderness or step off.  there is no midline tenderness there are no lesions noted. there is slight CVA tenderness  Musculoskeletal: No lower extremity tenderness, no upper extremity tenderness. No joint effusions, no DVT signs strong distal pulses no edema Neurologic:  Normal speech and language. No gross focal neurologic deficits are appreciated.  Skin:  Skin is warm, dry and intact. No rash noted. Psychiatric: Mood and affect are normal. Speech and behavior are normal.  ____________________________________________   LABS (all labs ordered are listed, but only abnormal results are displayed)  Labs Reviewed  LIPASE, BLOOD - Abnormal; Notable for the following:       Result Value   Lipase 61 (*)    All other components within normal limits  COMPREHENSIVE METABOLIC PANEL - Abnormal; Notable for the following:    Glucose, Bld 155 (*)    BUN 22 (*)    All other components within normal limits  CBC - Abnormal; Notable for the following:    WBC 12.0 (*)    RBC 3.46 (*)    Hemoglobin 10.0 (*)    HCT 30.2 (*)    Platelets 138 (*)    All  other components within normal limits  URINALYSIS, COMPLETE (UACMP) WITH MICROSCOPIC - Abnormal; Notable for the following:    Color, Urine YELLOW (*)    APPearance HAZY (*)    Nitrite POSITIVE (*)    Leukocytes, UA MODERATE (*)    Bacteria, UA RARE (*)    Squamous Epithelial / LPF 0-5 (*)    All other components within normal limits  URINE CULTURE   ____________________________________________  EKG  I personally interpreted any EKGs ordered by me or triage Normal sinus rhythm at 85 bpm no acute ST depression, LAD noted, no acute ischemia ____________________________________________  RADIOLOGY  I reviewed any imaging ordered by me or triage that were performed  during my shift and, if possible, patient and/or family made aware of any abnormal findings. ____________________________________________   PROCEDURES  Procedure(s) performed: None  Procedures  Critical Care performed: None  ____________________________________________   INITIAL IMPRESSION / ASSESSMENT AND PLAN / ED COURSE  Pertinent labs & imaging results that were available during my care of the patient were reviewed by me and considered in my medical decision making (see chart for details).  Patient here with urinary symptoms and what may be early pyelonephritis. Abdomen is nonsurgical and benign despite 3 days of symptoms. Urinalysis shows clear infection. Urine culture has been sent, we will start the patient on IV antibiotics pending urine culture. BUN/creatinine are reassuring glucose is reassuring, lateralized are reassuring, white count is reassuring, hemoglobin is reassuring, patient's preference is to go home if possible. Extensive return precautions and follow-up will be provided.  ----------------------------------------- 1:33 PM on 07/20/2016 -----------------------------------------  Patient in no acute distress laughing and joking, pain is very controlled without any medication, she does not wish  anything at this time. 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 She now discussed the limitations of workup including the limitations of antibiotics without culture guidance. She was as a culture is pending. She understands that she feels worse in any way she is to return to the emergency department. Return precautions and follow-up given and understood. Nothing to suggest kidney stone or other intra-abdominal pathology at this time. Do not think imaging is indicated. We will discharge the patient with close outpatient follow-up and return precautions.    ____________________________________________   FINAL CLINICAL IMPRESSION(S) / ED DIAGNOSES  Final diagnoses:  None      This chart was dictated using voice recognition software.  Despite best efforts to proofread,  errors can occur which can change meaning.      Schuyler Amor, MD 07/20/16 1202    Schuyler Amor, MD 07/20/16 1334    Schuyler Amor, MD 07/20/16 [id number]

## 2016-07-20 NOTE — ED Notes (Signed)
Pt states she does not want any meds for pain at this time

## 2016-07-20 NOTE — ED Triage Notes (Signed)
Pain left flank rad around to left low quad and mid upper abd started Tuesday.  Has also been urinating frequently.

## 2016-07-21 LAB — HM DIABETES EYE EXAM

## 2016-07-22 LAB — URINE CULTURE: Culture: >=100000 — AB

## 2016-07-26 ENCOUNTER — Telehealth: Payer: Self-pay | Admitting: Physician Assistant

## 2016-07-26 NOTE — Telephone Encounter (Signed)
Pt was discharged from [facility] 07/20/16 for pain and burning in her back and stomach.  I have scheduled a hospital follow up appointment/MW

## 2016-07-26 NOTE — Telephone Encounter (Signed)
Noted  

## 2016-07-28 ENCOUNTER — Encounter: Payer: Self-pay | Admitting: Physician Assistant

## 2016-07-28 ENCOUNTER — Encounter: Payer: Self-pay | Admitting: *Deleted

## 2016-07-28 ENCOUNTER — Ambulatory Visit (INDEPENDENT_AMBULATORY_CARE_PROVIDER_SITE_OTHER): Payer: Medicare Other | Admitting: Physician Assistant

## 2016-07-28 VITALS — BP 106/64 | HR 84 | Temp 98.0°F | Resp 16 | Wt 195.0 lb

## 2016-07-28 DIAGNOSIS — Z09 Encounter for follow-up examination after completed treatment for conditions other than malignant neoplasm: Secondary | ICD-10-CM

## 2016-07-28 DIAGNOSIS — N12 Tubulo-interstitial nephritis, not specified as acute or chronic: Secondary | ICD-10-CM | POA: Diagnosis not present

## 2016-07-28 DIAGNOSIS — R109 Unspecified abdominal pain: Secondary | ICD-10-CM | POA: Diagnosis not present

## 2016-07-28 LAB — POCT URINALYSIS DIPSTICK
BILIRUBIN UA: NEGATIVE
Glucose, UA: NEGATIVE
Ketones, UA: NEGATIVE
Leukocytes, UA: NEGATIVE
Nitrite, UA: NEGATIVE
PH UA: 6 (ref 5.0–8.0)
PROTEIN UA: NEGATIVE
RBC UA: NEGATIVE
SPEC GRAV UA: 1.015 (ref 1.010–1.025)
Urobilinogen, UA: 0.2 E.U./dL

## 2016-07-28 NOTE — Progress Notes (Signed)
Patient: Julie [name] Female    DOB: 11/14/1950   66 y.o.   MRN: [id number] Visit Date: 07/28/2016  Today's Provider: Mar Daring, PA-C   Chief Complaint  Patient presents with  . Hospitalization Follow-up   Subjective:    HPI  Follow Up ER Visit  Patient is here for ER follow up.  She was recently seen at [facility] for 07/20/16 for pyelonephritis. Treatment for this included Cephalexin 500mg  TID.  She reports good compliance with treatment. She reports this condition is Improved.     No Known Allergies   Current Outpatient Prescriptions:  .  cephALEXin (KEFLEX) 500 MG capsule, Take 1 capsule (500 mg total) by mouth 3 (three) times daily., Disp: 30 capsule, Rfl: 0 .  dapagliflozin propanediol (FARXIGA) 10 MG TABS tablet, Take 10 mg by mouth daily., Disp: , Rfl:  .  enalapril (VASOTEC) 20 MG tablet, Take 40 mg by mouth daily. , Disp: , Rfl:  .  glipiZIDE (GLUCOTROL XL) 10 MG 24 hr tablet, Take 10 mg by mouth daily with breakfast. , Disp: , Rfl:  .  Insulin Detemir (LEVEMIR FLEXPEN) 100 UNIT/ML Pen, Inject 48 Units into the skin at bedtime. , Disp: , Rfl:  .  JANUVIA 100 MG tablet, Take 100 mg by mouth daily. , Disp: , Rfl:  .  lovastatin (MEVACOR) 40 MG tablet, Take 40 mg by mouth at bedtime., Disp: , Rfl:  .  metFORMIN (GLUCOPHAGE) 500 MG tablet, Take 1,000 mg by mouth 2 (two) times daily with a meal. , Disp: , Rfl:  .  naproxen (NAPROSYN) 500 MG tablet, Take 1 tablet (500 mg total) by mouth 2 (two) times daily with a meal., Disp: 30 tablet, Rfl: 0 .  ondansetron (ZOFRAN) 4 MG tablet, Take 1 tablet (4 mg total) by mouth every 8 (eight) hours as needed for nausea or vomiting., Disp: 8 tablet, Rfl: 0  Review of Systems  Constitutional: Positive for activity change and fatigue. Negative for chills and fever.  Respiratory: Negative.   Cardiovascular: Negative.   Gastrointestinal: Negative for constipation, diarrhea and nausea.  Genitourinary: Negative for difficulty  urinating, flank pain and frequency.  Musculoskeletal: Positive for arthralgias.    Social History  Substance Use Topics  . Smoking status: Former Smoker    Packs/day: 0.50    Years: 20.00    Types: Cigarettes    Quit date: 02/13/2005  . Smokeless tobacco: Never Used  . Alcohol use No   Objective:   BP 106/64 (BP Location: Right Arm, Patient Position: Sitting, Cuff Size: Normal)   Pulse 84   Temp 98 F (36.7 C)   Resp 16   Wt 195 lb (88.5 kg)   SpO2 97%   BMI 32.45 kg/m  Vitals:   07/28/16 1553  BP: 106/64  Pulse: 84  Resp: 16  Temp: 98 F (36.7 C)  SpO2: 97%  Weight: 195 lb (88.5 kg)     Physical Exam  Constitutional: She is oriented to person, place, and time. She appears well-developed and well-nourished. No distress.  Cardiovascular: Normal rate, regular rhythm and normal heart sounds.  Exam reveals no gallop and no friction rub.   No murmur heard. Pulmonary/Chest: Effort normal and breath sounds normal. No respiratory distress. She has no wheezes. She has no rales.  Abdominal: Soft. Normal appearance and bowel sounds are normal. She exhibits no distension and no mass. There is no hepatosplenomegaly. There is no tenderness. There is no rebound, no guarding  and no CVA tenderness.  Neurological: She is alert and oriented to person, place, and time.  Skin: Skin is warm and dry. She is not diaphoretic.  Vitals reviewed.     Assessment & Plan:     1. Hospital discharge follow-up Patient reports doing well. She is still somewhat fatigued but this is improving. She has 1 more day on antibiotic. She is scheduled for colonoscopy on Monday 07/31/16. Mammogram and [place] is scheduled for 08/11/16.   2. Pyelonephritis UA today was normal.  3. Abdominal pain, unspecified abdominal location See above medical treatment plan. - POCT urinalysis dipstick       Mar Daring, PA-C  [facility]

## 2016-07-28 NOTE — Patient Instructions (Signed)
Pyelonephritis, Adult Pyelonephritis is a kidney infection. The kidneys are organs that help clean your blood by moving waste out of your blood and into your pee (urine). This infection can happen quickly, or it can last for a long time. In most cases, it clears up with treatment and does not cause other problems. Follow these instructions at home: Medicines  Take over-the-counter and prescription medicines only as told by your doctor.  Take your antibiotic medicine as told by your doctor. Do not stop taking the medicine even if you start to feel better. General instructions  Drink enough fluid to keep your pee clear or pale yellow.  Avoid caffeine, tea, and carbonated drinks.  Pee (urinate) often. Avoid holding in pee for long periods of time.  Pee before and after sex.  After pooping (having a bowel movement), women should wipe from front to back. Use each tissue only once.  Keep all follow-up visits as told by your doctor. This is important. Contact a doctor if:  You do not feel better after 2 days.  Your symptoms get worse.  You have a fever. Get help right away if:  You cannot take your medicine or drink fluids as told.  You have chills and shaking.  You throw up (vomit).  You have very bad pain in your side (flank) or back.  You feel very weak or you pass out (faint). This information is not intended to replace advice given to you by your health care provider. Make sure you discuss any questions you have with your health care provider. Document Released: 03/09/2004 Document Revised: 07/08/2015 Document Reviewed: 05/25/2014 Elsevier Interactive Patient Education  2018 Elsevier Inc[employer]  

## 2016-07-31 ENCOUNTER — Encounter: Payer: Self-pay | Admitting: *Deleted

## 2016-07-31 ENCOUNTER — Ambulatory Visit
Admission: RE | Admit: 2016-07-31 | Discharge: 2016-07-31 | Disposition: A | Discharge status: Home / Self Care | Payer: Medicare Other | Source: Ambulatory Visit | Attending: Gastroenterology | Admitting: Gastroenterology

## 2016-07-31 ENCOUNTER — Ambulatory Visit: Payer: Medicare Other | Admitting: Anesthesiology

## 2016-07-31 ENCOUNTER — Encounter
Admission: RE | Disposition: A | Discharge status: Home / Self Care | Payer: Self-pay | Source: Ambulatory Visit | Attending: Gastroenterology

## 2016-07-31 DIAGNOSIS — N189 Chronic kidney disease, unspecified: Secondary | ICD-10-CM | POA: Diagnosis not present

## 2016-07-31 DIAGNOSIS — K635 Polyp of colon: Secondary | ICD-10-CM | POA: Diagnosis not present

## 2016-07-31 DIAGNOSIS — K5730 Diverticulosis of large intestine without perforation or abscess without bleeding: Secondary | ICD-10-CM | POA: Insufficient documentation

## 2016-07-31 DIAGNOSIS — Z87891 Personal history of nicotine dependence: Secondary | ICD-10-CM | POA: Diagnosis not present

## 2016-07-31 DIAGNOSIS — I129 Hypertensive chronic kidney disease with stage 1 through stage 4 chronic kidney disease, or unspecified chronic kidney disease: Secondary | ICD-10-CM | POA: Insufficient documentation

## 2016-07-31 DIAGNOSIS — D123 Benign neoplasm of transverse colon: Secondary | ICD-10-CM | POA: Diagnosis not present

## 2016-07-31 DIAGNOSIS — D125 Benign neoplasm of sigmoid colon: Secondary | ICD-10-CM | POA: Insufficient documentation

## 2016-07-31 DIAGNOSIS — Z79899 Other long term (current) drug therapy: Secondary | ICD-10-CM | POA: Diagnosis not present

## 2016-07-31 DIAGNOSIS — Z794 Long term (current) use of insulin: Secondary | ICD-10-CM | POA: Diagnosis not present

## 2016-07-31 DIAGNOSIS — K5790 Diverticulosis of intestine, part unspecified, without perforation or abscess without bleeding: Secondary | ICD-10-CM | POA: Diagnosis not present

## 2016-07-31 DIAGNOSIS — E1122 Type 2 diabetes mellitus with diabetic chronic kidney disease: Secondary | ICD-10-CM | POA: Insufficient documentation

## 2016-07-31 DIAGNOSIS — B1920 Unspecified viral hepatitis C without hepatic coma: Secondary | ICD-10-CM | POA: Diagnosis not present

## 2016-07-31 DIAGNOSIS — Z86010 Personal history of colonic polyps: Secondary | ICD-10-CM | POA: Diagnosis not present

## 2016-07-31 DIAGNOSIS — Z8711 Personal history of peptic ulcer disease: Secondary | ICD-10-CM | POA: Diagnosis not present

## 2016-07-31 DIAGNOSIS — I10 Essential (primary) hypertension: Secondary | ICD-10-CM | POA: Diagnosis not present

## 2016-07-31 HISTORY — DX: Personal history of peptic ulcer disease: Z87.11

## 2016-07-31 HISTORY — DX: Type 2 diabetes mellitus with ketoacidosis without coma: E11.10

## 2016-07-31 HISTORY — DX: Chronic kidney disease, unspecified: N18.9

## 2016-07-31 HISTORY — DX: Inflammatory liver disease, unspecified: K75.9

## 2016-07-31 HISTORY — DX: Essential (primary) hypertension: I10

## 2016-07-31 HISTORY — DX: Anemia, unspecified: D64.9

## 2016-07-31 HISTORY — DX: Proteinuria, unspecified: R80.9

## 2016-07-31 HISTORY — PX: COLONOSCOPY WITH PROPOFOL: SHX5780

## 2016-07-31 HISTORY — DX: Type 2 diabetes mellitus without complications: E11.9

## 2016-07-31 LAB — GLUCOSE, CAPILLARY: GLUCOSE-CAPILLARY: 120 mg/dL — AB (ref 65–99)

## 2016-07-31 SURGERY — COLONOSCOPY WITH PROPOFOL
Anesthesia: General

## 2016-07-31 MED ORDER — PROPOFOL 500 MG/50ML IV EMUL
INTRAVENOUS | Status: AC
  Filled 2016-07-31: qty 50

## 2016-07-31 MED ORDER — SODIUM CHLORIDE 0.9 % IV SOLN: INTRAVENOUS | Status: DC

## 2016-07-31 MED ORDER — PROPOFOL 10 MG/ML IV BOLUS
INTRAVENOUS | Status: DC | PRN
  Administered 2016-07-31: 50 mg via INTRAVENOUS
  Administered 2016-07-31: 30 mg via INTRAVENOUS

## 2016-07-31 MED ORDER — PROPOFOL 500 MG/50ML IV EMUL
INTRAVENOUS | Status: DC | PRN
  Administered 2016-07-31: 150 ug/kg/min via INTRAVENOUS

## 2016-07-31 MED ORDER — SODIUM CHLORIDE 0.9 % IV SOLN
INTRAVENOUS | Status: DC
  Administered 2016-07-31: 07:00:00 via INTRAVENOUS

## 2016-07-31 NOTE — Anesthesia Preprocedure Evaluation (Signed)
Anesthesia Evaluation  Patient identified by MRN, date of birth, ID band Patient awake    Reviewed: Allergy & Precautions, NPO status , Patient's Chart, lab work & pertinent test results  Airway Mallampati: II       Dental  (+) Upper Dentures, Lower Dentures   Pulmonary neg pulmonary ROS, former smoker,    breath sounds clear to auscultation       Cardiovascular Exercise Tolerance: Good hypertension, Pt. on medications  Rhythm:Regular     Neuro/Psych    GI/Hepatic negative GI ROS, (+) Hepatitis -, C  Endo/Other  diabetes, Type 1, Insulin Dependent, Oral Hypoglycemic Agents  Renal/GU      Musculoskeletal   Abdominal (+) + obese,   Peds negative pediatric ROS (+)  Hematology  (+) anemia ,   Anesthesia Other Findings   Reproductive/Obstetrics                             Anesthesia Physical Anesthesia Plan  ASA: III  Anesthesia Plan: General   Post-op Pain Management:    Induction: Intravenous  PONV Risk Score and Plan: 0  Airway Management Planned: Natural Airway and Nasal Cannula  Additional Equipment:   Intra-op Plan:   Post-operative Plan:   Informed Consent: I have reviewed the patients History and Physical, chart, labs and discussed the procedure including the risks, benefits and alternatives for the proposed anesthesia with the patient or authorized representative who has indicated his/her understanding and acceptance.     Plan Discussed with: CRNA  Anesthesia Plan Comments:         Anesthesia Quick Evaluation

## 2016-07-31 NOTE — Anesthesia Post-op Follow-up Note (Cosign Needed)
Anesthesia QCDR form completed.        

## 2016-07-31 NOTE — Anesthesia Procedure Notes (Signed)
Date/Time: 07/31/2016 7:48 AM Performed by: Nelda Marseille Pre-anesthesia Checklist: Patient identified, Emergency Drugs available, Suction available, Patient being monitored and Timeout performed Oxygen Delivery Method: Nasal cannula

## 2016-07-31 NOTE — Op Note (Signed)
[facility] Patient Name: Julie [name] Procedure Date: 07/31/2016 7:35 AM MRN: [id number] Account #: [id number] Date of Birth: 06-Jul-1950 Admit Type: Outpatient Age: 66 Room: [facility] ENDO ROOM 1 Gender: Female Note Status: Finalized Procedure:            Colonoscopy Indications:          Personal history of colonic polyps Providers:            Lollie Sails, MD Referring MD:         Mar Daring (Referring MD) Medicines:            Monitored Anesthesia Care Complications:        No immediate complications. Procedure:            Pre-Anesthesia Assessment:                       - ASA Grade Assessment: III - A patient with severe                        systemic disease.                       After obtaining informed consent, the colonoscope was                        passed under direct vision. Throughout the procedure,                        the patient's blood pressure, pulse, and oxygen                        saturations were monitored continuously. The                        Colonoscope was introduced through the anus and                        advanced to the the cecum, identified by appendiceal                        orifice and ileocecal valve. The patient tolerated the                        procedure well. The quality of the bowel preparation                        was good. The colonoscopy was performed without                        difficulty. Findings:      A 3 mm polyp was found in the hepatic flexure. The polyp was sessile.       The polyp was removed with a cold biopsy forceps. Resection and       retrieval were complete.      A 1 mm polyp was found in the distal sigmoid colon. The polyp was       sessile. The polyp was removed with a cold biopsy forceps. Resection and       retrieval were complete.      Multiple small-mouthed diverticula were found in the sigmoid colon and  descending colon.      The digital  rectal exam was normal. Impression:           - One 3 mm polyp at the hepatic flexure, removed with a                        cold biopsy forceps. Resected and retrieved.                       - One 1 mm polyp in the distal sigmoid colon, removed                        with a cold biopsy forceps. Resected and retrieved.                       - Diverticulosis in the sigmoid colon and in the                        descending colon. Recommendation:       - Discharge patient to home.                       - Telephone GI clinic for pathology results in 1 week. Procedure Code(s):    --- Professional ---                       [phone], Colonoscopy, flexible; with biopsy, single or                        multiple Diagnosis Code(s):    --- Professional ---                       D12.3, Benign neoplasm of transverse colon (hepatic                        flexure or splenic flexure)                       D12.5, Benign neoplasm of sigmoid colon                       Z86.010, Personal history of colonic polyps                       K57.30, Diverticulosis of large intestine without                        perforation or abscess without bleeding CPT copyright 2016 American Medical Association. All rights reserved. The codes documented in this report are preliminary and upon coder review may  be revised to meet current compliance requirements. Lollie Sails, MD 07/31/2016 8:21:15 AM This report has been signed electronically. Number of Addenda: 0 Note Initiated On: 07/31/2016 7:35 AM Scope Withdrawal Time: 0 hours 11 minutes 49 seconds  Total Procedure Duration: 0 hours 26 minutes 35 seconds       [facility]

## 2016-07-31 NOTE — Anesthesia Postprocedure Evaluation (Signed)
Anesthesia Post Note  Patient: Julie [name]  Procedure(s) Performed: Procedure(s) (LRB): COLONOSCOPY WITH PROPOFOL (N/A)  Patient location during evaluation: PACU Anesthesia Type: General Level of consciousness: awake Pain management: pain level controlled Vital Signs Assessment: post-procedure vital signs reviewed and stable Respiratory status: spontaneous breathing Cardiovascular status: stable Anesthetic complications: no     Last Vitals:  Vitals:   07/31/16 0841 07/31/16 0851  BP: 135/75 (!) 145/80  Pulse: 73 73  Resp: 20 19  Temp:      Last Pain:  Vitals:   07/31/16 0821  TempSrc: Tympanic                 VAN STAVEREN,Keo Schirmer

## 2016-07-31 NOTE — H&P (Signed)
Outpatient short stay form Pre-procedure 07/31/2016 7:43 AM Lollie Sails MD  Primary Physician: Maurine Minister NP  Reason for visit:  Patient is a 66 year old female presenting today for a colonoscopy. History of present illness:   Patient is a 66 year old female presenting today for a colonoscopy. She has personal history of adenomatous colon polyps. She tolerated her prep well. She takes no aspirin or blood thinning agents.      Current Facility-Administered Medications:  .  0.9 %  sodium chloride infusion, , Intravenous, Continuous, Lollie Sails, MD, Last Rate: 20 mL/hr at 07/31/16 0724 .  0.9 %  sodium chloride infusion, , Intravenous, Continuous, Lollie Sails, MD  Prescriptions Prior to Admission  Medication Sig Dispense Refill Last Dose  . metFORMIN (GLUCOPHAGE) 500 MG tablet Take 1,000 mg by mouth 2 (two) times daily with a meal.    07/29/2016 at Unknown time  . enalapril (VASOTEC) 20 MG tablet Take 40 mg by mouth daily.    Taking  . glipiZIDE (GLUCOTROL XL) 10 MG 24 hr tablet Take 10 mg by mouth daily with breakfast.    07/29/2016  . Insulin Detemir (LEVEMIR FLEXPEN) 100 UNIT/ML Pen Inject 48 Units into the skin at bedtime.    07/29/2016  . JANUVIA 100 MG tablet Take 100 mg by mouth daily.    07/29/2016  . naproxen (NAPROSYN) 500 MG tablet Take 1 tablet (500 mg total) by mouth 2 (two) times daily with a meal. 30 tablet 0 07/29/2016  . ondansetron (ZOFRAN) 4 MG tablet Take 1 tablet (4 mg total) by mouth every 8 (eight) hours as needed for nausea or vomiting. (Patient not taking: Reported on 07/31/2016) 8 tablet 0 Not Taking at Unknown time     No Known Allergies   Past Medical History:  Diagnosis Date  . Anemia   . Chronic kidney disease   . Diabetes mellitus without complication ([place])   . 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 Hepatitis    hepatitis c  . History of peptic ulcer disease   . Hypertension   . Microalbuminuria     Review of systems:      Physical  Exam    Heart and lungs: Regular rate and rhythm without rub or gallop, lungs are bilaterally clear.    HEENT: Normocephalic atraumatic eyes are anicteric    Other:     Pertinant exam for procedure: Soft nontender nondistended bowel sounds positive normoactive.    Planned proceedures: Colonoscopy and indicated procedures. I have discussed the risks benefits and complications of procedures to include not limited to bleeding, infection, perforation and the risk of sedation and the patient wishes to proceed.    Lollie Sails, MD Gastroenterology 07/31/2016  7:43 AM

## 2016-07-31 NOTE — Transfer of Care (Signed)
Immediate Anesthesia Transfer of Care Note  Patient: Julie [name]  Procedure(s) Performed: Procedure(s): COLONOSCOPY WITH PROPOFOL (N/A)  Patient Location: PACU  Anesthesia Type:General  Level of Consciousness: sedated  Airway & Oxygen Therapy: Patient Spontanous Breathing and Patient connected to nasal cannula oxygen  Post-op Assessment: Report given to RN and Post -op Vital signs reviewed and stable  Post vital signs: Reviewed and stable  Last Vitals:  Vitals:   07/31/16 0702 07/31/16 0821  BP: 134/81 111/66  Pulse: 81 76  Resp: 16 18  Temp: (!) 36.1 C 36.1 C    Last Pain:  Vitals:   07/31/16 0821  TempSrc: Tympanic         Complications: No apparent anesthesia complications

## 2016-08-01 ENCOUNTER — Encounter: Payer: Self-pay | Admitting: Gastroenterology

## 2016-08-01 LAB — SURGICAL PATHOLOGY

## 2016-08-02 DIAGNOSIS — N2889 Other specified disorders of kidney and ureter: Secondary | ICD-10-CM | POA: Diagnosis not present

## 2016-08-02 DIAGNOSIS — Z6832 Body mass index (BMI) 32.0-32.9, adult: Secondary | ICD-10-CM | POA: Diagnosis not present

## 2016-08-02 DIAGNOSIS — D179 Benign lipomatous neoplasm, unspecified: Secondary | ICD-10-CM | POA: Diagnosis not present

## 2016-08-02 DIAGNOSIS — D1771 Benign lipomatous neoplasm of kidney: Secondary | ICD-10-CM | POA: Diagnosis not present

## 2016-08-02 DIAGNOSIS — N281 Cyst of kidney, acquired: Secondary | ICD-10-CM | POA: Diagnosis not present

## 2016-08-08 DIAGNOSIS — E1129 Type 2 diabetes mellitus with other diabetic kidney complication: Secondary | ICD-10-CM | POA: Diagnosis not present

## 2016-08-08 DIAGNOSIS — Z794 Long term (current) use of insulin: Secondary | ICD-10-CM | POA: Diagnosis not present

## 2016-08-08 DIAGNOSIS — R809 Proteinuria, unspecified: Secondary | ICD-10-CM | POA: Diagnosis not present

## 2016-08-11 ENCOUNTER — Ambulatory Visit
Admission: RE | Admit: 2016-08-11 | Discharge: 2016-08-11 | Disposition: A | Discharge status: Home / Self Care | Payer: Medicare Other | Source: Ambulatory Visit | Attending: Physician Assistant | Admitting: Physician Assistant

## 2016-08-11 DIAGNOSIS — R928 Other abnormal and inconclusive findings on diagnostic imaging of breast: Secondary | ICD-10-CM | POA: Diagnosis not present

## 2016-08-11 DIAGNOSIS — N6489 Other specified disorders of breast: Secondary | ICD-10-CM | POA: Diagnosis not present

## 2016-08-11 DIAGNOSIS — I708 Atherosclerosis of other arteries: Secondary | ICD-10-CM | POA: Diagnosis not present

## 2016-08-11 DIAGNOSIS — N6321 Unspecified lump in the left breast, upper outer quadrant: Secondary | ICD-10-CM | POA: Insufficient documentation

## 2016-08-12 ENCOUNTER — Telehealth: Payer: Self-pay

## 2016-08-12 NOTE — Telephone Encounter (Signed)
LMTCB  Thanks,  -Joseline 

## 2016-08-12 NOTE — Telephone Encounter (Signed)
-----   Message from Mar Daring, [place] sent at 08/11/2016  4:14 PM EDT ----- Seemingly stable left breast mass. Recommended to have bilateral diagnostic mammogram with possible left breast US in one year.

## 2016-08-14 NOTE — Telephone Encounter (Signed)
Patient advised as below.  

## 2016-08-15 DIAGNOSIS — R809 Proteinuria, unspecified: Secondary | ICD-10-CM | POA: Diagnosis not present

## 2016-08-15 DIAGNOSIS — Z794 Long term (current) use of insulin: Secondary | ICD-10-CM | POA: Diagnosis not present

## 2016-08-15 DIAGNOSIS — N2889 Other specified disorders of kidney and ureter: Secondary | ICD-10-CM | POA: Diagnosis not present

## 2016-08-15 DIAGNOSIS — D179 Benign lipomatous neoplasm, unspecified: Secondary | ICD-10-CM | POA: Diagnosis not present

## 2016-08-15 DIAGNOSIS — E1129 Type 2 diabetes mellitus with other diabetic kidney complication: Secondary | ICD-10-CM | POA: Diagnosis not present

## 2016-08-17 DIAGNOSIS — I10 Essential (primary) hypertension: Secondary | ICD-10-CM | POA: Diagnosis not present

## 2016-08-17 DIAGNOSIS — Z79899 Other long term (current) drug therapy: Secondary | ICD-10-CM | POA: Diagnosis not present

## 2016-08-17 DIAGNOSIS — Z87891 Personal history of nicotine dependence: Secondary | ICD-10-CM | POA: Diagnosis not present

## 2016-08-17 DIAGNOSIS — E1169 Type 2 diabetes mellitus with other specified complication: Secondary | ICD-10-CM | POA: Diagnosis not present

## 2016-08-17 DIAGNOSIS — Z7984 Long term (current) use of oral hypoglycemic drugs: Secondary | ICD-10-CM | POA: Diagnosis not present

## 2016-08-17 DIAGNOSIS — R93422 Abnormal radiologic findings on diagnostic imaging of left kidney: Secondary | ICD-10-CM | POA: Diagnosis not present

## 2016-08-17 DIAGNOSIS — E785 Hyperlipidemia, unspecified: Secondary | ICD-10-CM | POA: Diagnosis not present

## 2016-08-17 DIAGNOSIS — R93429 Abnormal radiologic findings on diagnostic imaging of unspecified kidney: Secondary | ICD-10-CM | POA: Diagnosis not present

## 2016-08-30 ENCOUNTER — Encounter: Payer: Self-pay | Admitting: Physician Assistant

## 2016-08-30 ENCOUNTER — Ambulatory Visit (INDEPENDENT_AMBULATORY_CARE_PROVIDER_SITE_OTHER): Payer: Medicare Other | Admitting: Physician Assistant

## 2016-08-30 VITALS — BP 128/70 | HR 79 | Temp 97.9°F | Resp 16 | Wt 186.6 lb

## 2016-08-30 DIAGNOSIS — D179 Benign lipomatous neoplasm, unspecified: Secondary | ICD-10-CM

## 2016-08-30 DIAGNOSIS — N3000 Acute cystitis without hematuria: Secondary | ICD-10-CM | POA: Diagnosis not present

## 2016-08-30 DIAGNOSIS — S37012A Minor contusion of left kidney, initial encounter: Secondary | ICD-10-CM

## 2016-08-30 LAB — POCT URINALYSIS DIPSTICK
Bilirubin, UA: NEGATIVE
Glucose, UA: NEGATIVE
Ketones, UA: NEGATIVE
Leukocytes, UA: NEGATIVE
NITRITE UA: NEGATIVE
PH UA: 6 (ref 5.0–8.0)
PROTEIN UA: NEGATIVE
RBC UA: NEGATIVE
Spec Grav, UA: 1.01 (ref 1.010–1.025)
Urobilinogen, UA: 0.2 E.U./dL

## 2016-08-30 NOTE — Progress Notes (Signed)
Patient: Julie [name] Female    DOB: Dec 12, 1950   66 y.o.   MRN: [id number] Visit Date: 08/30/2016  Today's Provider: Mar Daring, PA-C   No chief complaint on file.  Subjective:    HPI Patient is here with c/o recheck urine. Patient went to ED [facility] in [place].History of abnormal CT showing Hematoma and active bleeding and E. Coli positive urine culture. She was put on Bactrim 800-160 tab one (1) tablet twice daily for 7 days. She finished Bactrim yesterday. She was advised to have her urine check after she finished the antibiotic. She reports that she was feeling lightheaded, weak, fatigue some of the days and dragging yesterday, but that today she is feeling "Perky". She feels the symptoms she was having was from the Bactrim. She reports she does feel like herself now and is having no symptoms.      No Known Allergies   Current Outpatient Prescriptions:  .  enalapril (VASOTEC) 20 MG tablet, Take 40 mg by mouth daily. , Disp: , Rfl:  .  glipiZIDE (GLUCOTROL XL) 10 MG 24 hr tablet, Take 10 mg by mouth daily with breakfast. , Disp: , Rfl:  .  Insulin Detemir (LEVEMIR FLEXPEN) 100 UNIT/ML Pen, Inject 48 Units into the skin at bedtime. , Disp: , Rfl:  .  JANUVIA 100 MG tablet, Take 100 mg by mouth daily. , Disp: , Rfl:  .  metFORMIN (GLUCOPHAGE) 500 MG tablet, Take 1,000 mg by mouth 2 (two) times daily with a meal. , Disp: , Rfl:  .  naproxen (NAPROSYN) 500 MG tablet, Take 1 tablet (500 mg total) by mouth 2 (two) times daily with a meal., Disp: 30 tablet, Rfl: 0 .  ondansetron (ZOFRAN) 4 MG tablet, Take 1 tablet (4 mg total) by mouth every 8 (eight) hours as needed for nausea or vomiting. (Patient not taking: Reported on 07/31/2016), Disp: 8 tablet, Rfl: 0  Review of Systems  Constitutional: Negative for fatigue.  Respiratory: Negative for cough, chest tightness and shortness of breath.   Cardiovascular: Negative for chest pain, palpitations and leg swelling.    Gastrointestinal: Negative for abdominal pain, anal bleeding, blood in stool, constipation, diarrhea, nausea and vomiting.  Genitourinary: Negative for decreased urine volume, dysuria, enuresis, flank pain, frequency, hematuria, menstrual problem, urgency, vaginal bleeding, vaginal discharge and vaginal pain.  Musculoskeletal: Negative for back pain.  Neurological: Negative for dizziness, weakness and light-headedness.    Social History  Substance Use Topics  . Smoking status: Former Smoker    Packs/day: 0.50    Years: 20.00    Types: Cigarettes    Quit date: 02/13/2005  . Smokeless tobacco: Never Used  . Alcohol use No   Objective:   BP 128/70 (BP Location: Left Arm, Patient Position: Sitting, Cuff Size: Normal)   Pulse 79   Temp 97.9 F (36.6 C) (Oral)   Resp 16   Wt 186 lb 9.6 oz (84.6 kg)   BMI 31.05 kg/m    Physical Exam  Constitutional: She is oriented to person, place, and time. She appears well-developed and well-nourished. No distress.  Cardiovascular: Normal rate, regular rhythm and normal heart sounds.  Exam reveals no gallop and no friction rub.   No murmur heard. Pulmonary/Chest: Effort normal and breath sounds normal. No respiratory distress. She has no wheezes. She has no rales.  Abdominal: Soft. Normal appearance and bowel sounds are normal. She exhibits no distension and no mass. There is no hepatosplenomegaly. There  is no tenderness. There is no rebound, no guarding and no CVA tenderness.  Neurological: She is alert and oriented to person, place, and time.  Skin: Skin is warm and dry. She is not diaphoretic.  Vitals reviewed.      Assessment & Plan:     1. Acute cystitis without hematuria UA normal today. Suspect UTI cleared.  - POCT urinalysis dipstick  2. Angiomyolipoma Followed by Dr. Bernardo Heater.   3. Hematoma of left kidney, initial encounter Recently seen in [facility] ED due to abnormal renal CT showing a subscapular hematoma measuring 3.2cm in diameter  with mass effect. Being that patient was hemodynamically stable, and still appears to be today in the office, she is scheduled with IR on 09/08/16 for further evaluation and possible coiling if needed.        Mar Daring, PA-C  [facility]

## 2016-09-04 ENCOUNTER — Other Ambulatory Visit: Payer: Self-pay | Admitting: Physician Assistant

## 2016-09-04 ENCOUNTER — Ambulatory Visit
Admission: RE | Admit: 2016-09-04 | Discharge: 2016-09-04 | Disposition: A | Discharge status: Home / Self Care | Payer: Medicare Other | Source: Ambulatory Visit | Attending: Physician Assistant | Admitting: Physician Assistant

## 2016-09-04 DIAGNOSIS — R2232 Localized swelling, mass and lump, left upper limb: Secondary | ICD-10-CM | POA: Insufficient documentation

## 2016-09-04 DIAGNOSIS — M7989 Other specified soft tissue disorders: Secondary | ICD-10-CM

## 2016-09-04 DIAGNOSIS — R222 Localized swelling, mass and lump, trunk: Secondary | ICD-10-CM | POA: Diagnosis not present

## 2016-09-06 DIAGNOSIS — Z6831 Body mass index (BMI) 31.0-31.9, adult: Secondary | ICD-10-CM | POA: Diagnosis not present

## 2016-09-06 DIAGNOSIS — N2889 Other specified disorders of kidney and ureter: Secondary | ICD-10-CM | POA: Diagnosis not present

## 2016-09-22 DIAGNOSIS — D3002 Benign neoplasm of left kidney: Secondary | ICD-10-CM | POA: Diagnosis not present

## 2016-10-02 DIAGNOSIS — K5730 Diverticulosis of large intestine without perforation or abscess without bleeding: Secondary | ICD-10-CM | POA: Diagnosis not present

## 2016-10-02 DIAGNOSIS — Z86010 Personal history of colonic polyps: Secondary | ICD-10-CM | POA: Diagnosis not present

## 2016-10-09 ENCOUNTER — Other Ambulatory Visit: Payer: Self-pay | Admitting: Physician Assistant

## 2016-10-09 DIAGNOSIS — M7581 Other shoulder lesions, right shoulder: Secondary | ICD-10-CM

## 2016-11-03 DIAGNOSIS — S301XXA Contusion of abdominal wall, initial encounter: Secondary | ICD-10-CM | POA: Diagnosis not present

## 2016-11-03 DIAGNOSIS — D179 Benign lipomatous neoplasm, unspecified: Secondary | ICD-10-CM | POA: Diagnosis not present

## 2016-12-04 ENCOUNTER — Encounter: Payer: Self-pay | Admitting: Physician Assistant

## 2016-12-21 ENCOUNTER — Ambulatory Visit (INDEPENDENT_AMBULATORY_CARE_PROVIDER_SITE_OTHER): Payer: Medicare Other | Admitting: Physician Assistant

## 2016-12-21 ENCOUNTER — Encounter: Payer: Self-pay | Admitting: Physician Assistant

## 2016-12-21 VITALS — BP 120/70 | Temp 97.7°F | Resp 16 | Wt 197.6 lb

## 2016-12-21 DIAGNOSIS — H6123 Impacted cerumen, bilateral: Secondary | ICD-10-CM | POA: Diagnosis not present

## 2016-12-21 DIAGNOSIS — D1771 Benign lipomatous neoplasm of kidney: Secondary | ICD-10-CM

## 2016-12-21 DIAGNOSIS — Z23 Encounter for immunization: Secondary | ICD-10-CM

## 2016-12-21 DIAGNOSIS — D171 Benign lipomatous neoplasm of skin and subcutaneous tissue of trunk: Secondary | ICD-10-CM

## 2016-12-21 NOTE — Progress Notes (Signed)
Patient: Julie [name] Female    DOB: 07/23/1950   66 y.o.   MRN: [id number] Visit Date: 12/21/2016  Today's Provider: Mar Daring, PA-C   Chief Complaint  Patient presents with  . Cerumen Impaction   Subjective:    HPI patient is here today with c/o cerumen impaction and Flu vaccine. She is also requesting the results from her CTA for the angiomyolipoma in the left kidney. She also has a lipoma on the left side between the left axilla and left shoulder blade. She is interested in resection.      No Known Allergies   Current Outpatient Medications:  .  enalapril (VASOTEC) 20 MG tablet, Take 40 mg by mouth daily. , Disp: , Rfl:  .  glipiZIDE (GLUCOTROL XL) 10 MG 24 hr tablet, Take 10 mg by mouth daily with breakfast. , Disp: , Rfl:  .  Insulin Detemir (LEVEMIR FLEXPEN) 100 UNIT/ML Pen, Inject 48 Units into the skin at bedtime. , Disp: , Rfl:  .  JANUVIA 100 MG tablet, Take 100 mg by mouth daily. , Disp: , Rfl:  .  metFORMIN (GLUCOPHAGE) 500 MG tablet, Take 1,000 mg by mouth 2 (two) times daily with a meal. , Disp: , Rfl:  .  naproxen (NAPROSYN) 500 MG tablet, TAKE 1 TABLET BY MOUTH TWICE DAILY WITH A MEAL (Patient not taking: Reported on 12/21/2016), Disp: 30 tablet, Rfl: 5 .  ondansetron (ZOFRAN) 4 MG tablet, Take 1 tablet (4 mg total) by mouth every 8 (eight) hours as needed for nausea or vomiting. (Patient not taking: Reported on 07/31/2016), Disp: 8 tablet, Rfl: 0  Review of Systems  Constitutional: Negative for fever.  HENT: Negative.   Respiratory: Negative.   Cardiovascular: Negative for chest pain, palpitations and leg swelling.  Genitourinary: Negative.     Social History   Tobacco Use  . Smoking status: Former Smoker    Packs/day: 0.50    Years: 20.00    Pack years: 10.00    Types: Cigarettes    Last attempt to quit: 02/13/2005    Years since quitting: 11.8  . Smokeless tobacco: Never Used  Substance Use Topics  . Alcohol use: No   Objective:     BP 120/70 (BP Location: Left Arm, Patient Position: Sitting, Cuff Size: Normal)   Temp 97.7 F (36.5 C) (Oral)   Resp 16   Wt 197 lb 9.6 oz (89.6 kg)   BMI 32.88 kg/m    Physical Exam  Constitutional: She appears well-developed and well-nourished. No distress.  HENT:  Head: Normocephalic and atraumatic.  Right Ear: Hearing, tympanic membrane, external ear and ear canal normal.  Left Ear: Hearing, tympanic membrane, external ear and ear canal normal.  Nose: Nose normal.  Mouth/Throat: Uvula is midline, oropharynx is clear and moist and mucous membranes are normal. No oropharyngeal exudate.  Bilateral cerumen impaction noted with successful lavage  Eyes: Conjunctivae are normal. Pupils are equal, round, and reactive to light. Right eye exhibits no discharge. Left eye exhibits no discharge. No scleral icterus.  Neck: Normal range of motion. Neck supple. No tracheal deviation present. No thyromegaly present.  Cardiovascular: Normal rate, regular rhythm and normal heart sounds. Exam reveals no gallop and no friction rub.  No murmur heard. Pulmonary/Chest: Effort normal and breath sounds normal. No stridor. No respiratory distress. She has no wheezes. She has no rales.      Lymphadenopathy:    She has no cervical adenopathy.  Skin: Skin  is warm and dry. She is not diaphoretic.  Vitals reviewed.      Assessment & Plan:     1. Bilateral impacted cerumen Ear lavage successful. Advised against qtip usage as old cotton tips were removed with lavage today. Advised to use Debrox drops weekly to keep wax soft.   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 Decreased in size on CTA noted from 11/03/16. Followed by [facility].  3. Lipoma of torso Patient is interested in resection. No symptoms just large and "unsightly". States clothes do not fit right. She is going to call Dr. Bary Castilla to see if he feels it can be removed.   4. Need for influenza vaccination Flu vaccine given  today without complication. Patient sat upright for 15 minutes to check for adverse reaction before being released. - Flu vaccine HIGH DOSE PF       Mar Daring, PA-C  [facility]

## 2016-12-21 NOTE — Patient Instructions (Signed)
Earwax Buildup, Adult The ears produce a substance called earwax that helps keep bacteria out of the ear and protects the skin in the ear canal. Occasionally, earwax can build up in the ear and cause discomfort or hearing loss. What increases the risk? This condition is more likely to develop in people who:  Are female.  Are elderly.  Naturally produce more earwax.  Clean their ears often with cotton swabs.  Use earplugs often.  Use in-ear headphones often.  Wear hearing aids.  Have narrow ear canals.  Have earwax that is overly thick or sticky.  Have eczema.  Are dehydrated.  Have excess hair in the ear canal.  What are the signs or symptoms? Symptoms of this condition include:  Reduced or muffled hearing.  A feeling of fullness in the ear or feeling that the ear is plugged.  Fluid coming from the ear.  Ear pain.  Ear itch.  Ringing in the ear.  Coughing.  An obvious piece of earwax that can be seen inside the ear canal.  How is this diagnosed? This condition may be diagnosed based on:  Your symptoms.  Your medical history.  An ear exam. During the exam, your health care provider will look into your ear with an instrument called an otoscope.  You may have tests, including a hearing test. How is this treated? This condition may be treated by:  Using ear drops to soften the earwax.  Having the earwax removed by a health care provider. The health care provider may: ? Flush the ear with water. ? Use an instrument that has a loop on the end (curette). ? Use a suction device.  Surgery to remove the wax buildup. This may be done in severe cases.  Follow these instructions at home:  Take over-the-counter and prescription medicines only as told by your health care provider.  Do not put any objects, including cotton swabs, into your ear. You can clean the opening of your ear canal with a washcloth or facial tissue.  Follow instructions from your health  care provider about cleaning your ears. Do not over-clean your ears.  Drink enough fluid to keep your urine clear or pale yellow. This will help to thin the earwax.  Keep all follow-up visits as told by your health care provider. If earwax builds up in your ears often or if you use hearing aids, consider seeing your health care provider for routine, preventive ear cleanings. Ask your health care provider how often you should schedule your cleanings.  If you have hearing aids, clean them according to instructions from the manufacturer and your health care provider. Contact a health care provider if:  You have ear pain.  You develop a fever.  You have blood, pus, or other fluid coming from your ear.  You have hearing loss.  You have ringing in your ears that does not go away.  Your symptoms do not improve with treatment.  You feel like the room is spinning (vertigo). Summary  Earwax can build up in the ear and cause discomfort or hearing loss.  The most common symptoms of this condition include reduced or muffled hearing and a feeling of fullness in the ear or feeling that the ear is plugged.  This condition may be diagnosed based on your symptoms, your medical history, and an ear exam.  This condition may be treated by using ear drops to soften the earwax or by having the earwax removed by a health care provider.  Do   not put any objects, including cotton swabs, into your ear. You can clean the opening of your ear canal with a washcloth or facial tissue. This information is not intended to replace advice given to you by your health care provider. Make sure you discuss any questions you have with your health care provider. Document Released: 03/09/2004 Document Revised: 04/12/2016 Document Reviewed: 04/12/2016 Elsevier Interactive Patient Education  2018 Elsevier Inc.  

## 2016-12-22 ENCOUNTER — Encounter: Payer: Self-pay | Admitting: *Deleted

## 2016-12-25 ENCOUNTER — Encounter: Payer: Self-pay | Admitting: General Surgery

## 2016-12-25 ENCOUNTER — Ambulatory Visit (INDEPENDENT_AMBULATORY_CARE_PROVIDER_SITE_OTHER): Payer: Medicare Other | Admitting: General Surgery

## 2016-12-25 VITALS — BP 130/70 | HR 92 | Resp 14 | Ht 65.0 in | Wt 201.0 lb

## 2016-12-25 DIAGNOSIS — D213 Benign neoplasm of connective and other soft tissue of thorax: Secondary | ICD-10-CM

## 2016-12-25 DIAGNOSIS — D171 Benign lipomatous neoplasm of skin and subcutaneous tissue of trunk: Secondary | ICD-10-CM

## 2016-12-25 NOTE — Progress Notes (Signed)
Patient ID: Julie [name], female   DOB: [birth year], 66 y.o.   MRN: [id number]  Chief Complaint  Patient presents with  . Other    left axillary mass    HPI Julie [name] is a 66 y.o. female here for evaluation of a left axillary mass. She has had this for several years. She states it has enlarged but no discomfort noted, but it does bother her when she has her bra on. She had an ultrasound done on 09/04/16.    HPI  Past Medical History:  Diagnosis Date  . Anemia   . Chronic kidney disease   . Diabetes mellitus without complication ([place])   . 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 Hepatitis    hepatitis c, treated  . History of peptic ulcer disease   . Hypertension   . Microalbuminuria     Past Surgical History:  Procedure Laterality Date  . ABDOMINAL HYSTERECTOMY  1985  . LAPAROSCOPIC HYSTERECTOMY     assisted vaginal hysterectomy, fibroids, ovaries removed  . LIVER BIOPSY  2009   + hepatitis C, referred to [facility] by Gustavo Lah 04/2008.  Marland Kitchen NEPHROSTOMY W/ INTRODUCTION OF CATHETER    . TUBAL LIGATION      Family History  Problem Relation Age of Onset  . Arthritis Mother   . Hypertension Mother   . Stroke Father   . Alcohol abuse Father   . Cirrhosis Father   . Cancer Maternal Aunt   . Diabetes Son   . Pancreatic disease Brother   . Healthy Brother   . Healthy Brother   . Breast cancer Neg Hx     Social History Social History   Tobacco Use  . Smoking status: Former Smoker    Packs/day: 0.50    Years: 20.00    Pack years: 10.00    Types: Cigarettes    Last attempt to quit: 02/13/2005    Years since quitting: 11.8  . Smokeless tobacco: Never Used  Substance Use Topics  . Alcohol use: No  . Drug use: No    No Known Allergies  Current Outpatient Medications  Medication Sig Dispense Refill  . enalapril (VASOTEC) 20 MG tablet Take 40 mg by mouth daily.     Marland Kitchen glipiZIDE (GLUCOTROL XL) 10 MG 24 hr tablet Take 10 mg by mouth daily with breakfast.     . Insulin Detemir  (LEVEMIR FLEXPEN) 100 UNIT/ML Pen Inject 48 Units into the skin at bedtime.     Marland Kitchen JANUVIA 100 MG tablet Take 100 mg by mouth daily.     . metFORMIN (GLUCOPHAGE) 500 MG tablet Take 1,000 mg by mouth 2 (two) times daily with a meal.     . naproxen (NAPROSYN) 500 MG tablet TAKE 1 TABLET BY MOUTH TWICE DAILY WITH A MEAL 30 tablet 5  . ondansetron (ZOFRAN) 4 MG tablet Take 1 tablet (4 mg total) by mouth every 8 (eight) hours as needed for nausea or vomiting. 8 tablet 0   No current facility-administered medications for this visit.     Review of Systems Review of Systems  Constitutional: Negative.   Respiratory: Negative.   Cardiovascular: Negative.     Blood pressure 130/70, pulse 92, resp. rate 14, height 5\' 5"  (1.651 m), weight 201 lb (91.2 kg).  Physical Exam Physical Exam  Constitutional: She is oriented to person, place, and time. She appears well-developed and well-nourished.  Eyes: Conjunctivae are normal. No scleral icterus.  Neck: Neck supple.  Cardiovascular: Normal rate, regular rhythm  and normal heart sounds.  Pulmonary/Chest: Effort normal and breath sounds normal.    There is a 10 x 14 cm mass in the right axilla posterior line   Lymphadenopathy:    She has no cervical adenopathy.  Neurological: She is alert and oriented to person, place, and time.  Skin: Skin is warm and dry.  Psychiatric: She has a normal mood and affect.    Data Reviewed Laboratory studies dated July 31, 2016 showed a hemoglobin of 10.0 with a MCV of 87, white blood cell count of 12,000, platelet count of 138,000.  Comprehensive metabolic panel notable for a blood sugar of 155?  Fasting, creatinine 0.7, normal liver function studies.  Hepatitis C testing December 03, 2013 showed no HCV/RNA.  Ultrasound examination dated September 04, 2016 described a 7.5 x 7.8 cm mass.  Review of the images shows a prominent vascular pedicle within the central portion of the lesion.  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  Assessment    Large lipoma left lateral chest wall.    Plan    Slowly enlarging mass, lipoma versus low-grade liposarcoma.  Indications for excision reviewed.  Risks associated with surgery including bleeding/infection discussed.  Previously treated hepatitis C with no detectable RNA as of 2015.  Long history diabetes (+10 years).    HPI, Physical Exam, Assessment and Plan have been scribed under the direction and in the presence of Robert Bellow, MD  Concepcion Living, LPN  I have completed the exam and reviewed the above documentation for accuracy and completeness.  I agree with the above.  Haematologist has been used and any errors in dictation or transcription are unintentional.  Hervey Ard, M.D., F.A.C.S.    Robert Bellow 12/25/2016, 5:01 PM  Patient's surgery has been scheduled for 12-29-16 at [facility].   Dominga Ferry, CMA

## 2016-12-27 ENCOUNTER — Encounter
Admission: RE | Admit: 2016-12-27 | Discharge: 2016-12-27 | Disposition: A | Discharge status: Home / Self Care | Payer: Medicare Other | Source: Ambulatory Visit | Attending: General Surgery | Admitting: General Surgery

## 2016-12-27 ENCOUNTER — Other Ambulatory Visit: Payer: Self-pay

## 2016-12-27 DIAGNOSIS — Z79899 Other long term (current) drug therapy: Secondary | ICD-10-CM | POA: Diagnosis not present

## 2016-12-27 DIAGNOSIS — Z794 Long term (current) use of insulin: Secondary | ICD-10-CM | POA: Diagnosis not present

## 2016-12-27 DIAGNOSIS — R222 Localized swelling, mass and lump, trunk: Secondary | ICD-10-CM | POA: Diagnosis not present

## 2016-12-27 DIAGNOSIS — E109 Type 1 diabetes mellitus without complications: Secondary | ICD-10-CM | POA: Diagnosis not present

## 2016-12-27 DIAGNOSIS — B1920 Unspecified viral hepatitis C without hepatic coma: Secondary | ICD-10-CM | POA: Diagnosis not present

## 2016-12-27 DIAGNOSIS — Z87891 Personal history of nicotine dependence: Secondary | ICD-10-CM | POA: Diagnosis not present

## 2016-12-27 DIAGNOSIS — E669 Obesity, unspecified: Secondary | ICD-10-CM | POA: Diagnosis not present

## 2016-12-27 DIAGNOSIS — D649 Anemia, unspecified: Secondary | ICD-10-CM | POA: Diagnosis not present

## 2016-12-27 HISTORY — DX: Unspecified osteoarthritis, unspecified site: M19.90

## 2016-12-27 LAB — CBC
HEMATOCRIT: 40.8 % (ref 35.0–47.0)
HEMOGLOBIN: 13.1 g/dL (ref 12.0–16.0)
MCH: 28.4 pg (ref 26.0–34.0)
MCHC: 32 g/dL (ref 32.0–36.0)
MCV: 88.5 fL (ref 80.0–100.0)
Platelets: 147 10*3/uL — ABNORMAL LOW (ref 150–440)
RBC: 4.61 MIL/uL (ref 3.80–5.20)
RDW: 14.6 % — AB (ref 11.5–14.5)
WBC: 7.4 10*3/uL (ref 3.6–11.0)

## 2016-12-27 LAB — BASIC METABOLIC PANEL
Anion gap: 10 (ref 5–15)
BUN: 23 mg/dL — AB (ref 6–20)
CALCIUM: 9.4 mg/dL (ref 8.9–10.3)
CHLORIDE: 106 mmol/L (ref 101–111)
CO2: 20 mmol/L — AB (ref 22–32)
CREATININE: 0.65 mg/dL (ref 0.44–1.00)
GFR calc non Af Amer: >60 mL/min (ref >60)
GLUCOSE: 178 mg/dL — AB (ref 65–99)
Potassium: 4.4 mmol/L (ref 3.5–5.1)
Sodium: 136 mmol/L (ref 135–145)

## 2016-12-27 NOTE — Patient Instructions (Signed)
Your procedure is scheduled on: 12/29/16 Report to Day Surgery. [facility] SECOND FLOOR To find out your arrival time please call [phone] between 1PM - 3PM on 12/28/16   Remember: Instructions that are not followed completely may result in serious medical risk, up to and including death, or upon the discretion of your surgeon and anesthesiologist your surgery may need to be rescheduled.     _X__ 1. Do not eat food after midnight the night before your procedure.                 No gum chewing or hard candies. You may drink clear liquids up to 2 hours                 before you are scheduled to arrive for your surgery- DO not drink clear                 liquids within 2 hours of the start of your surgery.                 Clear Liquids include:  water, apple juice without pulp, clear carbohydrate                 drink such as Clearfast of Gartorade, Black Coffee or Tea (Do not add                 anything to coffee or tea).     _X__ 2.  No Alcohol for 24 hours before or after surgery.   _X__ 3.  Do Not Smoke or use e-cigarettes For 24 Hours Prior to Your Surgery.                 Do not use any chewable tobacco products for at least 6 hours prior to                 surgery.  ____  4.  Bring all medications with you on the day of surgery if instructed.   _X___  5.  Notify your doctor if there is any change in your medical condition      (cold, fever, infections).     Do not wear jewelry, make-up, hairpins, clips or nail polish. Do not wear lotions, powders, or perfumes. You may wear deodorant. Do not shave 48 hours prior to surgery. Men may shave face and neck. Do not bring valuables to the hospital.    [facility] is not responsible for any belongings or valuables.  Contacts, dentures or bridgework may not be worn into surgery. Leave your suitcase in the car. After surgery it may be brought to your room. For patients admitted to the hospital, discharge  time is determined by your treatment team.   Patients discharged the day of surgery will not be allowed to drive home.   Please read over the following fact sheets that you were given:   Surgical Site Infection Prevention          ____ Take these medicines the morning of surgery with A SIP OF WATER:    1. NONE  2.   3.   4.  5.  6.  ____ Fleet Enema (as directed)   _X___ Use CHG Soap as directed  ____ Use inhalers on the day of surgery  ___X_ Stop metformin 2 days prior to surgery LAST DOSE 12/27/16 AM   _X_ Take 1/2 of usual insulin dose the night before surgery. No insulin the morning  of surgery.   ____ Stop Coumadin/Plavix/aspirin on  ____ Stop Anti-inflammatories on    ____ Stop supplements until after surgery.    ____ Bring C-Pap to the hospital.

## 2016-12-28 MED ORDER — CEFAZOLIN SODIUM-DEXTROSE 2-4 GM/100ML-% IV SOLN
2.0000 g | INTRAVENOUS | Status: AC
  Administered 2016-12-29: 2 g via INTRAVENOUS

## 2016-12-29 ENCOUNTER — Ambulatory Visit: Payer: Medicare Other | Admitting: Certified Registered"

## 2016-12-29 ENCOUNTER — Ambulatory Visit
Admission: RE | Admit: 2016-12-29 | Discharge: 2016-12-29 | Disposition: A | Discharge status: Home / Self Care | Payer: Medicare Other | Source: Ambulatory Visit | Attending: General Surgery | Admitting: General Surgery

## 2016-12-29 ENCOUNTER — Encounter: Payer: Self-pay | Admitting: *Deleted

## 2016-12-29 ENCOUNTER — Encounter
Admission: RE | Disposition: A | Discharge status: Home / Self Care | Payer: Self-pay | Source: Ambulatory Visit | Attending: General Surgery

## 2016-12-29 DIAGNOSIS — B1920 Unspecified viral hepatitis C without hepatic coma: Secondary | ICD-10-CM | POA: Diagnosis not present

## 2016-12-29 DIAGNOSIS — C493 Malignant neoplasm of connective and soft tissue of thorax: Secondary | ICD-10-CM | POA: Diagnosis not present

## 2016-12-29 DIAGNOSIS — D649 Anemia, unspecified: Secondary | ICD-10-CM | POA: Insufficient documentation

## 2016-12-29 DIAGNOSIS — Z87891 Personal history of nicotine dependence: Secondary | ICD-10-CM | POA: Insufficient documentation

## 2016-12-29 DIAGNOSIS — R222 Localized swelling, mass and lump, trunk: Secondary | ICD-10-CM | POA: Diagnosis not present

## 2016-12-29 DIAGNOSIS — E669 Obesity, unspecified: Secondary | ICD-10-CM | POA: Insufficient documentation

## 2016-12-29 DIAGNOSIS — Z794 Long term (current) use of insulin: Secondary | ICD-10-CM | POA: Insufficient documentation

## 2016-12-29 DIAGNOSIS — D171 Benign lipomatous neoplasm of skin and subcutaneous tissue of trunk: Secondary | ICD-10-CM | POA: Diagnosis not present

## 2016-12-29 DIAGNOSIS — Z79899 Other long term (current) drug therapy: Secondary | ICD-10-CM | POA: Insufficient documentation

## 2016-12-29 DIAGNOSIS — N189 Chronic kidney disease, unspecified: Secondary | ICD-10-CM | POA: Diagnosis not present

## 2016-12-29 DIAGNOSIS — E109 Type 1 diabetes mellitus without complications: Secondary | ICD-10-CM | POA: Diagnosis not present

## 2016-12-29 DIAGNOSIS — I129 Hypertensive chronic kidney disease with stage 1 through stage 4 chronic kidney disease, or unspecified chronic kidney disease: Secondary | ICD-10-CM | POA: Diagnosis not present

## 2016-12-29 DIAGNOSIS — E1022 Type 1 diabetes mellitus with diabetic chronic kidney disease: Secondary | ICD-10-CM | POA: Diagnosis not present

## 2016-12-29 HISTORY — PX: EXCISION MASS NECK: SHX6703

## 2016-12-29 LAB — POCT I-STAT 4, (NA,K, GLUC, HGB,HCT)
Glucose, Bld: 168 mg/dL — ABNORMAL HIGH (ref 65–99)
HEMATOCRIT: 41 % (ref 36.0–46.0)
Hemoglobin: 13.9 g/dL (ref 12.0–15.0)
Potassium: 4.2 mmol/L (ref 3.5–5.1)
SODIUM: 141 mmol/L (ref 135–145)

## 2016-12-29 LAB — GLUCOSE, CAPILLARY
Glucose-Capillary: 171 mg/dL — ABNORMAL HIGH (ref 65–99)
Glucose-Capillary: 160 mg/dL — ABNORMAL HIGH (ref 65–99)

## 2016-12-29 SURGERY — EXCISION, MASS, NECK
Anesthesia: General | Laterality: Left

## 2016-12-29 MED ORDER — FAMOTIDINE 20 MG PO TABS
20.0000 mg | ORAL_TABLET | Freq: Once | ORAL | Status: AC
  Administered 2016-12-29: 20 mg via ORAL

## 2016-12-29 MED ORDER — FENTANYL CITRATE (PF) 100 MCG/2ML IJ SOLN
INTRAMUSCULAR | Status: DC | PRN
  Administered 2016-12-29 (×2): 50 ug via INTRAVENOUS

## 2016-12-29 MED ORDER — LIDOCAINE HCL (PF) 2 % IJ SOLN
INTRAMUSCULAR | Status: AC
  Filled 2016-12-29: qty 10

## 2016-12-29 MED ORDER — ACETAMINOPHEN 10 MG/ML IV SOLN
INTRAVENOUS | Status: DC | PRN
  Administered 2016-12-29: 1000 mg via INTRAVENOUS

## 2016-12-29 MED ORDER — SODIUM CHLORIDE 0.9 % IV SOLN
INTRAVENOUS | Status: DC
  Administered 2016-12-29: 14:00:00 via INTRAVENOUS

## 2016-12-29 MED ORDER — FENTANYL CITRATE (PF) 100 MCG/2ML IJ SOLN
INTRAMUSCULAR | Status: AC
  Filled 2016-12-29: qty 2

## 2016-12-29 MED ORDER — ONDANSETRON HCL 4 MG/2ML IJ SOLN
INTRAMUSCULAR | Status: DC | PRN
  Administered 2016-12-29: 4 mg via INTRAVENOUS

## 2016-12-29 MED ORDER — KETOROLAC TROMETHAMINE 30 MG/ML IJ SOLN
INTRAMUSCULAR | Status: DC | PRN
  Administered 2016-12-29: 30 mg via INTRAVENOUS

## 2016-12-29 MED ORDER — KETOROLAC TROMETHAMINE 30 MG/ML IJ SOLN
INTRAMUSCULAR | Status: AC
  Filled 2016-12-29: qty 1

## 2016-12-29 MED ORDER — HYDROCODONE-ACETAMINOPHEN 5-325 MG PO TABS: 1.0000 | ORAL_TABLET | ORAL | 0 refills | Status: DC | PRN

## 2016-12-29 MED ORDER — GLYCOPYRROLATE 0.2 MG/ML IJ SOLN
INTRAMUSCULAR | Status: DC | PRN
Start: 2016-12-29 — End: 2016-12-29
  Administered 2016-12-29: 0.2 mg via INTRAVENOUS

## 2016-12-29 MED ORDER — SUCCINYLCHOLINE CHLORIDE 20 MG/ML IJ SOLN
INTRAMUSCULAR | Status: DC | PRN
  Administered 2016-12-29: 100 mg via INTRAVENOUS

## 2016-12-29 MED ORDER — DEXAMETHASONE SODIUM PHOSPHATE 10 MG/ML IJ SOLN
INTRAMUSCULAR | Status: AC
  Filled 2016-12-29: qty 1

## 2016-12-29 MED ORDER — GLYCOPYRROLATE 0.2 MG/ML IJ SOLN
INTRAMUSCULAR | Status: AC
  Filled 2016-12-29: qty 1

## 2016-12-29 MED ORDER — ACETAMINOPHEN 10 MG/ML IV SOLN
INTRAVENOUS | Status: AC
  Filled 2016-12-29: qty 100

## 2016-12-29 MED ORDER — MIDAZOLAM HCL 2 MG/2ML IJ SOLN
INTRAMUSCULAR | Status: AC
  Filled 2016-12-29: qty 2

## 2016-12-29 MED ORDER — BUPIVACAINE HCL (PF) 0.5 % IJ SOLN
INTRAMUSCULAR | Status: AC
  Filled 2016-12-29: qty 30

## 2016-12-29 MED ORDER — ONDANSETRON HCL 4 MG/2ML IJ SOLN
INTRAMUSCULAR | Status: AC
  Filled 2016-12-29: qty 2

## 2016-12-29 MED ORDER — PROPOFOL 10 MG/ML IV BOLUS
INTRAVENOUS | Status: DC | PRN
  Administered 2016-12-29: 50 mg via INTRAVENOUS
  Administered 2016-12-29: 150 mg via INTRAVENOUS

## 2016-12-29 MED ORDER — PHENYLEPHRINE HCL 10 MG/ML IJ SOLN
INTRAMUSCULAR | Status: DC | PRN
  Administered 2016-12-29: 100 ug via INTRAVENOUS

## 2016-12-29 MED ORDER — FENTANYL CITRATE (PF) 100 MCG/2ML IJ SOLN
25.0000 ug | INTRAMUSCULAR | Status: DC | PRN
  Administered 2016-12-29: 25 ug via INTRAVENOUS

## 2016-12-29 MED ORDER — BUPIVACAINE HCL (PF) 0.5 % IJ SOLN
INTRAMUSCULAR | Status: DC | PRN
  Administered 2016-12-29: 30 mL

## 2016-12-29 MED ORDER — HYDROCODONE-ACETAMINOPHEN 5-325 MG PO TABS
ORAL_TABLET | ORAL | Status: AC
  Filled 2016-12-29: qty 1

## 2016-12-29 MED ORDER — PROPOFOL 10 MG/ML IV BOLUS
INTRAVENOUS | Status: AC
  Filled 2016-12-29: qty 20

## 2016-12-29 MED ORDER — DEXAMETHASONE SODIUM PHOSPHATE 10 MG/ML IJ SOLN
INTRAMUSCULAR | Status: DC | PRN
  Administered 2016-12-29: 5 mg via INTRAVENOUS

## 2016-12-29 MED ORDER — ONDANSETRON HCL 4 MG/2ML IJ SOLN: 4.0000 mg | Freq: Once | INTRAMUSCULAR | Status: DC | PRN

## 2016-12-29 MED ORDER — HYDROCODONE-ACETAMINOPHEN 5-325 MG PO TABS
1.0000 | ORAL_TABLET | ORAL | Status: DC | PRN
  Administered 2016-12-29: 1 via ORAL

## 2016-12-29 MED ORDER — MIDAZOLAM HCL 2 MG/2ML IJ SOLN
INTRAMUSCULAR | Status: DC | PRN
  Administered 2016-12-29: 2 mg via INTRAVENOUS

## 2016-12-29 MED ORDER — KETAMINE HCL 50 MG/ML IJ SOLN: INTRAMUSCULAR | Status: DC | PRN

## 2016-12-29 SURGICAL SUPPLY — 31 items
BLADE SURG 15 STRL SS SAFETY (BLADE) ×3 IMPLANT
BULB RESERV EVAC DRAIN JP 100C (MISCELLANEOUS) ×3 IMPLANT
CANISTER SUCT 1200ML W/VALVE (MISCELLANEOUS) ×3 IMPLANT
CHLORAPREP W/TINT 26ML (MISCELLANEOUS) ×3 IMPLANT
CLOSURE WOUND 1/2 X4 (GAUZE/BANDAGES/DRESSINGS) ×1
DRAIN CHANNEL JP 19F (MISCELLANEOUS) ×3 IMPLANT
DRAPE LAPAROTOMY TRNSV 106X77 (MISCELLANEOUS) ×3 IMPLANT
DRSG OPSITE POSTOP 4X8 (GAUZE/BANDAGES/DRESSINGS) ×3 IMPLANT
DRSG TEGADERM 4X4.75 (GAUZE/BANDAGES/DRESSINGS) ×6 IMPLANT
DRSG TELFA 3X8 NADH (GAUZE/BANDAGES/DRESSINGS) ×3 IMPLANT
ELECT REM PT RETURN 9FT ADLT (ELECTROSURGICAL) ×3
ELECTRODE REM PT RTRN 9FT ADLT (ELECTROSURGICAL) ×1 IMPLANT
GLOVE BIO SURGEON STRL SZ7.5 (GLOVE) ×3 IMPLANT
GLOVE INDICATOR 8.0 STRL GRN (GLOVE) ×3 IMPLANT
GOWN STRL REUS W/ TWL LRG LVL3 (GOWN DISPOSABLE) ×2 IMPLANT
GOWN STRL REUS W/TWL LRG LVL3 (GOWN DISPOSABLE) ×4
KIT RM TURNOVER STRD PROC AR (KITS) ×3 IMPLANT
LABEL OR SOLS (LABEL) ×3 IMPLANT
NDL SAFETY 25GX1.5 (NEEDLE) ×3 IMPLANT
NS IRRIG 500ML POUR BTL (IV SOLUTION) ×3 IMPLANT
PACK BASIN MINOR ARMC (MISCELLANEOUS) ×3 IMPLANT
STAPLER SKIN PROX 35W (STAPLE) ×3 IMPLANT
STRIP CLOSURE SKIN 1/2X4 (GAUZE/BANDAGES/DRESSINGS) ×2 IMPLANT
SUT ETHILON 3-0 FS-10 30 BLK (SUTURE) ×3
SUT VIC AB 2-0 CT2 27 (SUTURE) ×15 IMPLANT
SUT VIC AB 3-0 SH 27 (SUTURE) ×2
SUT VIC AB 3-0 SH 27X BRD (SUTURE) ×1 IMPLANT
SUT VIC AB 4-0 FS2 27 (SUTURE) ×3 IMPLANT
SUTURE EHLN 3-0 FS-10 30 BLK (SUTURE) ×1 IMPLANT
SWABSTK COMLB BENZOIN TINCTURE (MISCELLANEOUS) ×3 IMPLANT
SYR CONTROL 10ML (SYRINGE) ×3 IMPLANT

## 2016-12-29 NOTE — Anesthesia Postprocedure Evaluation (Signed)
Anesthesia Post Note  Patient: Julie [name]  Procedure(s) Performed: CHEST WALL MASS EXCISION (Left )  Patient location during evaluation: PACU Anesthesia Type: General Level of consciousness: awake and alert and oriented Pain management: pain level controlled Vital Signs Assessment: post-procedure vital signs reviewed and stable Respiratory status: spontaneous breathing Cardiovascular status: blood pressure returned to baseline Anesthetic complications: no     Last Vitals:  Vitals:   12/29/16 1622 12/29/16 1623  BP:  (!) 168/85  Pulse: 76   Resp: 17   Temp: 36.7 C   SpO2: 98%     Last Pain:  Vitals:   12/29/16 1656  PainSc: 4                  [name]

## 2016-12-29 NOTE — Op Note (Signed)
Preoperative diagnosis: Soft tissue mass left posterior lateral chest wall, likely lipoma.  Postoperative diagnosis: Same.  Operative procedure: Excision left chest wall soft tissue mass with primary closure.  Operating Surgeon: Hervey Ard, MD.  Anesthesia: General endotracheal, Marcaine 0.5%, plain, 30 cc.  Estimated blood loss: Less than 30 cc.  Clinical note: This 66 year old female has had an enlarging soft tissue mass for the last several years has become increasingly symptomatic.  She was admitted for elective excision.  She received Kefzol prior to the procedure due to her history of insulin-dependent diabetes.  SCD stockings were used for DVT prevention.  Operative note: The patient underwent general endotracheal anesthesia and then was rolled in the right lateral decubitus position supported on a beanbag with an axillary roll.  The left arm was supported on 2 pillows.  She was taped in appropriate position and the left lateral chest wall cleansed with ChloraPrep and draped.  Marcaine was infiltrated for postoperative analgesia.  A 15 cm elliptical incision was completed to resect the 7 x 12 cm soft tissue mass.  This extended to the latissimus muscle posteriorly with a pedicle of fatty tissue extending into the axilla which was dissected free separately and tagged.  The anterior posterior aspects of the specimen was written on these skin with a marker and it was sent in formalin for routine histology.  There was a generous soft tissue defect, and this was completed in the event that a low-grade liposarcoma was identified additional surgery would not be required.  The soft tissues were elevated off the latissimus cyst cephalad and caudad to allow approximation of the fascia with interrupted 2-0 Vicryl sutures.  This was completed in multiple layers.  A running 2-0 Vicryl deep dermal layer was then applied and skin closed with staples.  Prior to fascial closure a 15-gauge Blake drain was  brought out through a separate stab wound incision and anchored into position with a 3-0 nylon suture.  A honeycomb dressing was applied to the surgical excision site and Telfa and Tegaderm to the drain site.  The patient tolerated the procedure well was taken recovery room in stable condition.

## 2016-12-29 NOTE — Discharge Instructions (Signed)

## 2016-12-29 NOTE — Anesthesia Preprocedure Evaluation (Addendum)
Anesthesia Evaluation  Patient identified by MRN, date of birth, ID band Patient awake    Reviewed: Allergy & Precautions, NPO status , Patient's Chart, lab work & pertinent test results  History of Anesthesia Complications Negative for: history of anesthetic complications  Airway Mallampati: II       Dental  (+) Upper Dentures, Lower Dentures   Pulmonary neg pulmonary ROS, former smoker,           Cardiovascular Exercise Tolerance: Good hypertension, Pt. on medications (-) angina(-) CAD, (-) Past MI and (-) Cardiac Stents (-) Valvular Problems/Murmurs     Neuro/Psych    GI/Hepatic negative GI ROS, (+) Hepatitis -, C  Endo/Other  diabetes, Type 1, Insulin Dependent, Oral Hypoglycemic Agents  Renal/GU CRFRenal disease     Musculoskeletal   Abdominal (+) + obese,   Peds negative pediatric ROS (+)  Hematology  (+) anemia ,   Anesthesia Other Findings Past Medical History: No date: Anemia No date: Arthritis No date: Chronic kidney disease No date: Diabetes mellitus without complication (HCC) No date: Diabetic acidosis (HCC) No date: Hepatitis     Comment:  hepatitis c, treated No date: History of peptic ulcer disease No date: Hypertension No date: Microalbuminuria   Reproductive/Obstetrics                            Anesthesia Physical  Anesthesia Plan  ASA: III  Anesthesia Plan: General   Post-op Pain Management:    Induction: Intravenous  PONV Risk Score and Plan: 3 and Ondansetron and Dexamethasone  Airway Management Planned: Oral ETT  Additional Equipment:   Intra-op Plan:   Post-operative Plan: Extubation in OR  Informed Consent: I have reviewed the patients History and Physical, chart, labs and discussed the procedure including the risks, benefits and alternatives for the proposed anesthesia with the patient or authorized representative who has indicated his/her  understanding and acceptance.     Plan Discussed with: CRNA  Anesthesia Plan Comments:        Anesthesia Quick Evaluation

## 2016-12-29 NOTE — Anesthesia Procedure Notes (Signed)
Procedure Name: Intubation Performed by: Princesa Willig, CRNA Pre-anesthesia Checklist: Patient identified, Patient being monitored, Timeout performed, Emergency Drugs available and Suction available Patient Re-evaluated:Patient Re-evaluated prior to induction Oxygen Delivery Method: Circle system utilized Preoxygenation: Pre-oxygenation with 100% oxygen Induction Type: IV induction Ventilation: Mask ventilation without difficulty Laryngoscope Size: Miller and 2 Grade View: Grade I Tube type: Oral Tube size: 7.0 mm Number of attempts: 1 Airway Equipment and Method: Stylet Placement Confirmation: ETT inserted through vocal cords under direct vision,  positive ETCO2 and breath sounds checked- equal and bilateral Secured at: 21 cm Tube secured with: Tape Dental Injury: Teeth and Oropharynx as per pre-operative assessment        

## 2016-12-29 NOTE — Transfer of Care (Signed)
Immediate Anesthesia Transfer of Care Note  Patient: Julie [name]  Procedure(s) Performed: Procedure(s): CHEST WALL MASS EXCISION (Left)  Patient Location: PACU  Anesthesia Type:General  Level of Consciousness: sedated  Airway & Oxygen Therapy: Patient Spontanous Breathing and Patient connected to face mask oxygen  Post-op Assessment: Report given to RN and Post -op Vital signs reviewed and stable  Post vital signs: Reviewed and stable  Last Vitals:  Vitals:   12/29/16 1337 12/29/16 1550  BP: (!) 155/87 (!) 144/86  Pulse: 80 77  Resp: 16 (!) 9  Temp: 36.8 C (!) 36 C  SpO2: 10% 932%    Complications: No apparent anesthesia complications

## 2016-12-29 NOTE — H&P (Signed)
No change in interval history or exam.  For excision of left posterior shoulder mass.

## 2016-12-29 NOTE — Anesthesia Post-op Follow-up Note (Signed)
Anesthesia QCDR form completed.        

## 2016-12-30 ENCOUNTER — Encounter: Payer: Self-pay | Admitting: General Surgery

## 2017-01-02 ENCOUNTER — Telehealth: Payer: Self-pay | Admitting: *Deleted

## 2017-01-02 ENCOUNTER — Ambulatory Visit (INDEPENDENT_AMBULATORY_CARE_PROVIDER_SITE_OTHER): Payer: Medicare Other | Admitting: General Surgery

## 2017-01-02 ENCOUNTER — Encounter: Payer: Self-pay | Admitting: General Surgery

## 2017-01-02 VITALS — BP 142/82 | HR 85 | Resp 14 | Ht 65.0 in | Wt 201.0 lb

## 2017-01-02 DIAGNOSIS — D171 Benign lipomatous neoplasm of skin and subcutaneous tissue of trunk: Secondary | ICD-10-CM

## 2017-01-02 LAB — SURGICAL PATHOLOGY

## 2017-01-02 NOTE — Telephone Encounter (Signed)
Notified patient as instructed, patient pleased. Discussed follow-up appointments, patient agrees  

## 2017-01-02 NOTE — Progress Notes (Signed)
Patient ID: Julie [name], female   DOB: Jun 16, 1950, 66 y.o.   MRN: [id number]  Chief Complaint  Patient presents with  . Routine Post Op    HPI Julie [name] is a 66 y.o. female.  Here for postoperative visit, excision chest wall mass on 12-29-16. Mild pain, more at arm pit area. Drainage is 60 ml a day(forgot sheet)  HPI  Past Medical History:  Diagnosis Date  . Anemia   . Arthritis   . Chronic kidney disease   . Diabetes mellitus without complication ([place])   . 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 Hepatitis    hepatitis c, treated  . History of peptic ulcer disease   . Hypertension   . Microalbuminuria     Past Surgical History:  Procedure Laterality Date  . ABDOMINAL HYSTERECTOMY  1985  . CHEST WALL MASS EXCISION Left 12/29/2016   Performed by Robert Bellow, MD at [facility] ORS  . COLONOSCOPY WITH PROPOFOL N/A 07/31/2016   Performed by Lollie Sails, MD at [place]  . LAPAROSCOPIC HYSTERECTOMY     assisted vaginal hysterectomy, fibroids, ovaries removed  . LIVER BIOPSY  2009   + hepatitis C, referred to [facility] by Gustavo Lah 04/2008.  Marland Kitchen NEPHROSTOMY W/ INTRODUCTION OF CATHETER    . TUBAL LIGATION      Family History  Problem Relation Age of Onset  . Arthritis Mother   . Hypertension Mother   . Stroke Father   . Alcohol abuse Father   . Cirrhosis Father   . Cancer Maternal Aunt   . Diabetes Son   . Pancreatic disease Brother   . Healthy Brother   . Healthy Brother   . Breast cancer Neg Hx     Social History Social History   Tobacco Use  . Smoking status: Former Smoker    Packs/day: 0.50    Years: 20.00    Pack years: 10.00    Types: Cigarettes    Last attempt to quit: 02/13/2005    Years since quitting: 11.8  . Smokeless tobacco: Never Used  Substance Use Topics  . Alcohol use: No  . Drug use: No    No Known Allergies  Current Outpatient Medications  Medication Sig Dispense Refill  . enalapril (VASOTEC) 20 MG tablet Take 40 mg by mouth  daily.     Marland Kitchen glipiZIDE (GLUCOTROL XL) 10 MG 24 hr tablet Take 10 mg by mouth daily with breakfast.     . HYDROcodone-acetaminophen (NORCO/VICODIN) 5-325 MG tablet Take 1-2 tablets every 4 (four) hours as needed by mouth for moderate pain. Take no more than 10 tablets daily. 30 tablet 0  . Insulin Detemir (LEVEMIR FLEXPEN) 100 UNIT/ML Pen Inject 48 Units into the skin at bedtime.     Marland Kitchen JANUVIA 100 MG tablet Take 100 mg by mouth daily.     . metFORMIN (GLUCOPHAGE) 500 MG tablet Take 1,000 mg by mouth 2 (two) times daily with a meal.     . Multiple Vitamins-Minerals (MULTIVITAMIN WITH MINERALS) tablet Take 1 tablet daily by mouth.    . naproxen (NAPROSYN) 500 MG tablet TAKE 1 TABLET BY MOUTH TWICE DAILY WITH A MEAL (Patient taking differently: TAKE 1 TABLET BY MOUTH TWICE DAILY AS NEEDED WITH A MEAL) 30 tablet 5   No current facility-administered medications for this visit.     Review of Systems Review of Systems  Constitutional: Negative.   Respiratory: Negative.   Cardiovascular: Negative.     Blood pressure (!) 142/82,  pulse 85, resp. rate 14, height 5\' 5"  (1.651 m), weight 201 lb (91.2 kg), SpO2 98 %.  Physical Exam Physical Exam  Constitutional: She is oriented to person, place, and time. She appears well-developed and well-nourished.  Pulmonary/Chest:    Incision clean left axilla, drain in place  Musculoskeletal:  Excellent upper extremity range of motion  Neurological: She is alert and oriented to person, place, and time.  Skin: Skin is warm and dry.  Psychiatric: Her behavior is normal.    Data Reviewed Pathology pending.  Assessment    Doing well status post excision of large axillary mass, likely simple lipoma.    Plan         Follow up one week   HPI, Physical Exam, Assessment and Plan have been scribed under the direction and in the presence of Robert Bellow, MD. Karie Fetch, RN  I have completed the exam and reviewed the above documentation for  accuracy and completeness.  I agree with the above.  Haematologist has been used and any errors in dictation or transcription are unintentional.  Hervey Ard, M.D., F.A.C.S.  Robert Bellow 01/02/2017, 11:18 AM

## 2017-01-02 NOTE — Patient Instructions (Signed)
The patient is aware to call back for any questions or concerns.  

## 2017-01-02 NOTE — Telephone Encounter (Signed)
-----   Message from Robert Bellow, MD sent at 01/02/2017  3:06 PM EST ----- Please notify the patient pathology was fine: just a fatty growth.  ----- Message ----- From: Interface, Lab In Three Zero One Sent: 01/02/2017  11:52 AM To: Robert Bellow, MD

## 2017-01-10 ENCOUNTER — Ambulatory Visit (INDEPENDENT_AMBULATORY_CARE_PROVIDER_SITE_OTHER): Payer: Medicare Other | Admitting: General Surgery

## 2017-01-10 ENCOUNTER — Encounter: Payer: Self-pay | Admitting: General Surgery

## 2017-01-10 VITALS — BP 142/80 | HR 82 | Resp 14 | Ht 65.0 in | Wt 202.0 lb

## 2017-01-10 DIAGNOSIS — D171 Benign lipomatous neoplasm of skin and subcutaneous tissue of trunk: Secondary | ICD-10-CM

## 2017-01-10 NOTE — Patient Instructions (Signed)
The patient is aware to call back for any questions or concerns.  

## 2017-01-10 NOTE — Progress Notes (Signed)
Patient ID: Julie [name], female   DOB: 03/31/1950, 66 y.o.   MRN: [id number]  Chief Complaint  Patient presents with  . Routine Post Op    HPI Julie [name] is a 66 y.o. female her for a post op chest wall mass excision on 12/29/16. She has her drain sheet with her. She reports that she is doing well and pain is very little.  HPI  Past Medical History:  Diagnosis Date  . Anemia   . Arthritis   . Chronic kidney disease   . Diabetes mellitus without complication ([place])   . 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 Hepatitis    hepatitis c, treated  . History of peptic ulcer disease   . Hypertension   . Microalbuminuria     Past Surgical History:  Procedure Laterality Date  . ABDOMINAL HYSTERECTOMY  1985  . COLONOSCOPY WITH PROPOFOL N/A 07/31/2016   Procedure: COLONOSCOPY WITH PROPOFOL;  Surgeon: Lollie Sails, MD;  Location: [facility] ENDOSCOPY;  Service: Endoscopy;  Laterality: N/A;  . EXCISION MASS NECK Left 12/29/2016   Procedure: CHEST WALL MASS EXCISION;  Surgeon: Robert Bellow, MD;  Location: [facility] ORS;  Service: General;  Laterality: Left;  . LAPAROSCOPIC HYSTERECTOMY     assisted vaginal hysterectomy, fibroids, ovaries removed  . LIVER BIOPSY  2009   + hepatitis C, referred to [facility] by Gustavo Lah 04/2008.  Marland Kitchen NEPHROSTOMY W/ INTRODUCTION OF CATHETER    . TUBAL LIGATION      Family History  Problem Relation Age of Onset  . Arthritis Mother   . Hypertension Mother   . Stroke Father   . Alcohol abuse Father   . Cirrhosis Father   . Cancer Maternal Aunt   . Diabetes Son   . Pancreatic disease Brother   . Healthy Brother   . Healthy Brother   . Breast cancer Neg Hx     Social History Social History   Tobacco Use  . Smoking status: Former Smoker    Packs/day: 0.50    Years: 20.00    Pack years: 10.00    Types: Cigarettes    Last attempt to quit: 02/13/2005    Years since quitting: 11.9  . Smokeless tobacco: Never Used  Substance Use Topics  . Alcohol use: No  .  Drug use: No    No Known Allergies  Current Outpatient Medications  Medication Sig Dispense Refill  . enalapril (VASOTEC) 20 MG tablet Take 40 mg by mouth daily.     Marland Kitchen glipiZIDE (GLUCOTROL XL) 10 MG 24 hr tablet Take 10 mg by mouth daily with breakfast.     . Insulin Detemir (LEVEMIR FLEXPEN) 100 UNIT/ML Pen Inject 48 Units into the skin at bedtime.     Marland Kitchen JANUVIA 100 MG tablet Take 100 mg by mouth daily.     . metFORMIN (GLUCOPHAGE) 500 MG tablet Take 1,000 mg by mouth 2 (two) times daily with a meal.     . Multiple Vitamins-Minerals (MULTIVITAMIN WITH MINERALS) tablet Take 1 tablet daily by mouth.    . naproxen (NAPROSYN) 500 MG tablet TAKE 1 TABLET BY MOUTH TWICE DAILY WITH A MEAL (Patient taking differently: TAKE 1 TABLET BY MOUTH TWICE DAILY AS NEEDED WITH A MEAL) 30 tablet 5  . HYDROcodone-acetaminophen (NORCO/VICODIN) 5-325 MG tablet Take 1-2 tablets every 4 (four) hours as needed by mouth for moderate pain. Take no more than 10 tablets daily. (Patient not taking: Reported on 01/10/2017) 30 tablet 0   No current facility-administered  medications for this visit.     Review of Systems Review of Systems  Constitutional: Negative.   Respiratory: Negative.   Cardiovascular: Negative.     Blood pressure (!) 142/80, pulse 82, resp. rate 14, height 5\' 5"  (1.651 m), weight 202 lb (91.6 kg).  Physical Exam Physical Exam  Constitutional: She is oriented to person, place, and time. She appears well-developed and well-nourished.  Pulmonary/Chest:    Drain and staples removed    Musculoskeletal:  Excellent upper extremity range of motion  Neurological: She is alert and oriented to person, place, and time.  Skin: Skin is warm and dry.  Psychiatric: Her behavior is normal.    Data Reviewed DIAGNOSIS:  A. MASS, LEFT POSTERIOR AXILLARY CHEST WALL; EXCISION:  - MATURE ADIPOSE TISSUE COMPATIBLE WITH LIPOMA.   Assessment    Doing well status post excision of axillary lipoma.     Plan    Patient was advised that she may develop a small fluid collection and she should call earlier if symptomatic.    Follow up in one week   HPI, Physical Exam, Assessment and Plan have been scribed under the direction and in the presence of Robert Bellow, MD. Karie Fetch, RN  I have completed the exam and reviewed the above documentation for accuracy and completeness.  I agree with the above.  Haematologist has been used and any errors in dictation or transcription are unintentional.  Hervey Ard, M.D., F.A.C.S.   Robert Bellow 01/10/2017, 8:37 PM

## 2017-01-18 ENCOUNTER — Ambulatory Visit (INDEPENDENT_AMBULATORY_CARE_PROVIDER_SITE_OTHER): Payer: Medicare Other | Admitting: General Surgery

## 2017-01-18 ENCOUNTER — Encounter: Payer: Self-pay | Admitting: General Surgery

## 2017-01-18 VITALS — BP 142/78 | HR 84 | Resp 14 | Ht 65.0 in | Wt 201.0 lb

## 2017-01-18 DIAGNOSIS — D171 Benign lipomatous neoplasm of skin and subcutaneous tissue of trunk: Secondary | ICD-10-CM

## 2017-01-18 NOTE — Patient Instructions (Signed)
Patient to return as needed. The patient is aware to call back for any questions or concerns. 

## 2017-01-18 NOTE — Progress Notes (Signed)
Patient ID: Julie [name], female   DOB: Nov 27, 1950, 66 y.o.   MRN: [id number]  Chief Complaint  Patient presents with  . Follow-up    HPI Julie [name] is a 66 y.o. female here today for her one week follow up chest wall excision done on 11/16/20418. Patient states the area is doing well. No new problems. HPI  Past Medical History:  Diagnosis Date  . Anemia   . Arthritis   . Chronic kidney disease   . Diabetes mellitus without complication ([place])   . 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 Hepatitis    hepatitis c, treated  . History of peptic ulcer disease   . Hypertension   . Microalbuminuria     Past Surgical History:  Procedure Laterality Date  . ABDOMINAL HYSTERECTOMY  1985  . COLONOSCOPY WITH PROPOFOL N/A 07/31/2016   Procedure: COLONOSCOPY WITH PROPOFOL;  Surgeon: Lollie Sails, MD;  Location[facility] ENDOSCOPY;  Service: Endoscopy;  Laterality: N/A;  . EXCISION MASS NECK Left 12/29/2016   Procedure: CHEST WALL MASS EXCISION;  Surgeon: Robert Bellow, MD;  Location: [facility] ORS;  Service: General;  Laterality: Left;  . LAPAROSCOPIC HYSTERECTOMY     assisted vaginal hysterectomy, fibroids, ovaries removed  . LIVER BIOPSY  2009   + hepatitis C, referred to [facility] by Gustavo Lah 04/2008.  Marland Kitchen NEPHROSTOMY W/ INTRODUCTION OF CATHETER    . TUBAL LIGATION      Family History  Problem Relation Age of Onset  . Arthritis Mother   . Hypertension Mother   . Stroke Father   . Alcohol abuse Father   . Cirrhosis Father   . Cancer Maternal Aunt   . Diabetes Son   . Pancreatic disease Brother   . Healthy Brother   . Healthy Brother   . Breast cancer Neg Hx     Social History Social History   Tobacco Use  . Smoking status: Former Smoker    Packs/day: 0.50    Years: 20.00    Pack years: 10.00    Types: Cigarettes    Last attempt to quit: 02/13/2005    Years since quitting: 11.9  . Smokeless tobacco: Never Used  Substance Use Topics  . Alcohol use: No  . Drug use: No    No  Known Allergies  Current Outpatient Medications  Medication Sig Dispense Refill  . enalapril (VASOTEC) 20 MG tablet Take 40 mg by mouth daily.     Marland Kitchen glipiZIDE (GLUCOTROL XL) 10 MG 24 hr tablet Take 10 mg by mouth daily with breakfast.     . HYDROcodone-acetaminophen (NORCO/VICODIN) 5-325 MG tablet Take 1-2 tablets every 4 (four) hours as needed by mouth for moderate pain. Take no more than 10 tablets daily. 30 tablet 0  . Insulin Detemir (LEVEMIR FLEXPEN) 100 UNIT/ML Pen Inject 48 Units into the skin at bedtime.     Marland Kitchen JANUVIA 100 MG tablet Take 100 mg by mouth daily.     . metFORMIN (GLUCOPHAGE) 500 MG tablet Take 1,000 mg by mouth 2 (two) times daily with a meal.     . Multiple Vitamins-Minerals (MULTIVITAMIN WITH MINERALS) tablet Take 1 tablet daily by mouth.    . naproxen (NAPROSYN) 500 MG tablet TAKE 1 TABLET BY MOUTH TWICE DAILY WITH A MEAL (Patient taking differently: TAKE 1 TABLET BY MOUTH TWICE DAILY AS NEEDED WITH A MEAL) 30 tablet 5   No current facility-administered medications for this visit.     Review of Systems Review of Systems  Constitutional: Negative.   Respiratory: Negative.   Cardiovascular: Negative.     Blood pressure (!) 142/78, pulse 84, resp. rate 14, height 5\' 5"  (1.651 m), weight 201 lb (91.2 kg).  Physical Exam Physical Exam  Constitutional: She is oriented to person, place, and time. She appears well-developed and well-nourished.  Pulmonary/Chest:    Neurological: She is alert and oriented to person, place, and time.  Skin: Skin is warm and dry.       Assessment    Doing well status post lipoma excision from the left axilla.    Plan         Patient to return as needed. The patient is aware to call back for any questions or concerns.  HPI, Physical Exam, Assessment and Plan have been scribed under the direction and in the presence of Hervey Ard, MD.  Gaspar Cola, CMA  .sds  Robert Bellow 01/18/2017, 8:09 PM

## 2017-01-26 LAB — HM DIABETES EYE EXAM

## 2017-02-08 DIAGNOSIS — Z794 Long term (current) use of insulin: Secondary | ICD-10-CM | POA: Diagnosis not present

## 2017-02-08 DIAGNOSIS — R809 Proteinuria, unspecified: Secondary | ICD-10-CM | POA: Diagnosis not present

## 2017-02-08 DIAGNOSIS — E1129 Type 2 diabetes mellitus with other diabetic kidney complication: Secondary | ICD-10-CM | POA: Diagnosis not present

## 2017-02-26 ENCOUNTER — Encounter: Payer: Self-pay | Admitting: General Surgery

## 2017-03-26 ENCOUNTER — Ambulatory Visit (INDEPENDENT_AMBULATORY_CARE_PROVIDER_SITE_OTHER): Payer: Medicare Other | Admitting: Physician Assistant

## 2017-03-26 ENCOUNTER — Other Ambulatory Visit: Payer: Self-pay

## 2017-03-26 ENCOUNTER — Encounter: Payer: Self-pay | Admitting: Physician Assistant

## 2017-03-26 ENCOUNTER — Other Ambulatory Visit: Payer: Self-pay | Admitting: Physician Assistant

## 2017-03-26 VITALS — BP 120/60 | HR 82 | Temp 98.0°F | Resp 16 | Ht 65.0 in | Wt 202.0 lb

## 2017-03-26 DIAGNOSIS — E119 Type 2 diabetes mellitus without complications: Secondary | ICD-10-CM | POA: Diagnosis not present

## 2017-03-26 DIAGNOSIS — N182 Chronic kidney disease, stage 2 (mild): Secondary | ICD-10-CM | POA: Diagnosis not present

## 2017-03-26 DIAGNOSIS — I10 Essential (primary) hypertension: Secondary | ICD-10-CM | POA: Diagnosis not present

## 2017-03-26 DIAGNOSIS — Z0000 Encounter for general adult medical examination without abnormal findings: Secondary | ICD-10-CM | POA: Diagnosis not present

## 2017-03-26 DIAGNOSIS — E7800 Pure hypercholesterolemia, unspecified: Secondary | ICD-10-CM

## 2017-03-26 DIAGNOSIS — N189 Chronic kidney disease, unspecified: Secondary | ICD-10-CM | POA: Insufficient documentation

## 2017-03-26 DIAGNOSIS — Z23 Encounter for immunization: Secondary | ICD-10-CM | POA: Diagnosis not present

## 2017-03-26 DIAGNOSIS — Z111 Encounter for screening for respiratory tuberculosis: Secondary | ICD-10-CM

## 2017-03-26 NOTE — Patient Instructions (Signed)
Health Maintenance for Postmenopausal Women Menopause is a normal process in which your reproductive ability comes to an end. This process happens gradually over a span of months to years, usually between the ages of 22 and 9. Menopause is complete when you have missed 12 consecutive menstrual periods. It is important to talk with your health care provider about some of the most common conditions that affect postmenopausal women, such as heart disease, cancer, and bone loss (osteoporosis). Adopting a healthy lifestyle and getting preventive care can help to promote your health and wellness. Those actions can also lower your chances of developing some of these common conditions. What should I know about menopause? During menopause, you may experience a number of symptoms, such as:  Moderate-to-severe hot flashes.  Night sweats.  Decrease in sex drive.  Mood swings.  Headaches.  Tiredness.  Irritability.  Memory problems.  Insomnia.  Choosing to treat or not to treat menopausal changes is an individual decision that you make with your health care provider. What should I know about hormone replacement therapy and supplements? Hormone therapy products are effective for treating symptoms that are associated with menopause, such as hot flashes and night sweats. Hormone replacement carries certain risks, especially as you become older. If you are thinking about using estrogen or estrogen with progestin treatments, discuss the benefits and risks with your health care provider. What should I know about heart disease and stroke? Heart disease, heart attack, and stroke become more likely as you age. This may be due, in part, to the hormonal changes that your body experiences during menopause. These can affect how your body processes dietary fats, triglycerides, and cholesterol. Heart attack and stroke are both medical emergencies. There are many things that you can do to help prevent heart disease  and stroke:  Have your blood pressure checked at least every 1-2 years. High blood pressure causes heart disease and increases the risk of stroke.  If you are 53-22 years old, ask your health care provider if you should take aspirin to prevent a heart attack or a stroke.  Do not use any tobacco products, including cigarettes, chewing tobacco, or electronic cigarettes. If you need help quitting, ask your health care provider.  It is important to eat a healthy diet and maintain a healthy weight. ? Be sure to include plenty of vegetables, fruits, low-fat dairy products, and lean protein. ? Avoid eating foods that are high in solid fats, added sugars, or salt (sodium).  Get regular exercise. This is one of the most important things that you can do for your health. ? Try to exercise for at least 150 minutes each week. The type of exercise that you do should increase your heart rate and make you sweat. This is known as moderate-intensity exercise. ? Try to do strengthening exercises at least twice each week. Do these in addition to the moderate-intensity exercise.  Know your numbers.Ask your health care provider to check your cholesterol and your blood glucose. Continue to have your blood tested as directed by your health care provider.  What should I know about cancer screening? There are several types of cancer. Take the following steps to reduce your risk and to catch any cancer development as early as possible. Breast Cancer  Practice breast self-awareness. ? This means understanding how your breasts normally appear and feel. ? It also means doing regular breast self-exams. Let your health care provider know about any changes, no matter how small.  If you are 40  or older, have a clinician do a breast exam (clinical breast exam or CBE) every year. Depending on your age, family history, and medical history, it may be recommended that you also have a yearly breast X-ray (mammogram).  If you  have a family history of breast cancer, talk with your health care provider about genetic screening.  If you are at high risk for breast cancer, talk with your health care provider about having an MRI and a mammogram every year.  Breast cancer (BRCA) gene test is recommended for women who have family members with BRCA-related cancers. Results of the assessment will determine the need for genetic counseling and BRCA1 and for BRCA2 testing. BRCA-related cancers include these types: ? Breast. This occurs in males or females. ? Ovarian. ? Tubal. This may also be called fallopian tube cancer. ? Cancer of the abdominal or pelvic lining (peritoneal cancer). ? Prostate. ? Pancreatic.  Cervical, Uterine, and Ovarian Cancer Your health care provider may recommend that you be screened regularly for cancer of the pelvic organs. These include your ovaries, uterus, and vagina. This screening involves a pelvic exam, which includes checking for microscopic changes to the surface of your cervix (Pap test).  For women ages 21-65, health care providers may recommend a pelvic exam and a Pap test every three years. For women ages 79-65, they may recommend the Pap test and pelvic exam, combined with testing for human papilloma virus (HPV), every five years. Some types of HPV increase your risk of cervical cancer. Testing for HPV may also be done on women of any age who have unclear Pap test results.  Other health care providers may not recommend any screening for nonpregnant women who are considered low risk for pelvic cancer and have no symptoms. Ask your health care provider if a screening pelvic exam is right for you.  If you have had past treatment for cervical cancer or a condition that could lead to cancer, you need Pap tests and screening for cancer for at least 20 years after your treatment. If Pap tests have been discontinued for you, your risk factors (such as having a new sexual partner) need to be  reassessed to determine if you should start having screenings again. Some women have medical problems that increase the chance of getting cervical cancer. In these cases, your health care provider may recommend that you have screening and Pap tests more often.  If you have a family history of uterine cancer or ovarian cancer, talk with your health care provider about genetic screening.  If you have vaginal bleeding after reaching menopause, tell your health care provider.  There are currently no reliable tests available to screen for ovarian cancer.  Lung Cancer Lung cancer screening is recommended for adults 69-62 years old who are at high risk for lung cancer because of a history of smoking. A yearly low-dose CT scan of the lungs is recommended if you:  Currently smoke.  Have a history of at least 30 pack-years of smoking and you currently smoke or have quit within the past 15 years. A pack-year is smoking an average of one pack of cigarettes per day for one year.  Yearly screening should:  Continue until it has been 15 years since you quit.  Stop if you develop a health problem that would prevent you from having lung cancer treatment.  Colorectal Cancer  This type of cancer can be detected and can often be prevented.  Routine colorectal cancer screening usually begins at  age 42 and continues through age 45.  If you have risk factors for colon cancer, your health care provider may recommend that you be screened at an earlier age.  If you have a family history of colorectal cancer, talk with your health care provider about genetic screening.  Your health care provider may also recommend using home test kits to check for hidden blood in your stool.  A small camera at the end of a tube can be used to examine your colon directly (sigmoidoscopy or colonoscopy). This is done to check for the earliest forms of colorectal cancer.  Direct examination of the colon should be repeated every  5-10 years until age 71. However, if early forms of precancerous polyps or small growths are found or if you have a family history or genetic risk for colorectal cancer, you may need to be screened more often.  Skin Cancer  Check your skin from head to toe regularly.  Monitor any moles. Be sure to tell your health care provider: ? About any new moles or changes in moles, especially if there is a change in a mole's shape or color. ? If you have a mole that is larger than the size of a pencil eraser.  If any of your family members has a history of skin cancer, especially at a young age, talk with your health care provider about genetic screening.  Always use sunscreen. Apply sunscreen liberally and repeatedly throughout the day.  Whenever you are outside, protect yourself by wearing long sleeves, pants, a wide-brimmed hat, and sunglasses.  What should I know about osteoporosis? Osteoporosis is a condition in which bone destruction happens more quickly than new bone creation. After menopause, you may be at an increased risk for osteoporosis. To help prevent osteoporosis or the bone fractures that can happen because of osteoporosis, the following is recommended:  If you are 46-71 years old, get at least 1,000 mg of calcium and at least 600 mg of vitamin D per day.  If you are older than age 55 but younger than age 65, get at least 1,200 mg of calcium and at least 600 mg of vitamin D per day.  If you are older than age 54, get at least 1,200 mg of calcium and at least 800 mg of vitamin D per day.  Smoking and excessive alcohol intake increase the risk of osteoporosis. Eat foods that are rich in calcium and vitamin D, and do weight-bearing exercises several times each week as directed by your health care provider. What should I know about how menopause affects my mental health? Depression may occur at any age, but it is more common as you become older. Common symptoms of depression  include:  Low or sad mood.  Changes in sleep patterns.  Changes in appetite or eating patterns.  Feeling an overall lack of motivation or enjoyment of activities that you previously enjoyed.  Frequent crying spells.  Talk with your health care provider if you think that you are experiencing depression. What should I know about immunizations? It is important that you get and maintain your immunizations. These include:  Tetanus, diphtheria, and pertussis (Tdap) booster vaccine.  Influenza every year before the flu season begins.  Pneumonia vaccine.  Shingles vaccine.  Your health care provider may also recommend other immunizations. This information is not intended to replace advice given to you by your health care provider. Make sure you discuss any questions you have with your health care provider. Document Released: 03/24/2005  Document Revised: 08/20/2015 Document Reviewed: 11/03/2014 Elsevier Interactive Patient Education  2018 [employer]vier Inc.  

## 2017-03-26 NOTE — Progress Notes (Signed)
Name: Julie [name]   MRN: [id number]    DOB: August 26, 1950   Date:03/26/2017       Progress Note  Subjective  Chief Complaint  Chief Complaint  Patient presents with  . Health Assesment    HPI  Julie [name] is a 67 y.o. female. She feels well. She reports exercising,walks. 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  Last Eye exam:01/2017 per pt at [facility].   No problem-specific Assessment & Plan notes found for this encounter.   Past Medical History:  Diagnosis Date  . Anemia   . Arthritis   . Chronic kidney disease   . Diabetes mellitus without complication ([place])   . Diabetic acidosis ([place])   . Hepatitis    hepatitis c, treated  . History of peptic ulcer disease   . Hypertension   . Microalbuminuria     Past Surgical History:  Procedure Laterality Date  . ABDOMINAL HYSTERECTOMY  1985  . COLONOSCOPY WITH PROPOFOL N/A 07/31/2016   Procedure: COLONOSCOPY WITH PROPOFOL;  Surgeon: Lollie Sails, MD;  Location: [facility] ENDOSCOPY;  Service: Endoscopy;  Laterality: N/A;  . EXCISION MASS NECK Left 12/29/2016   Procedure: CHEST WALL MASS EXCISION;  Surgeon: Robert Bellow, MD;  Location: [facility] ORS;  Service: General;  Laterality: Left;  . LAPAROSCOPIC HYSTERECTOMY     assisted vaginal hysterectomy, fibroids, ovaries removed  . LIVER BIOPSY  2009   + hepatitis C, referred to [facility] by Gustavo Lah 04/2008.  Marland Kitchen NEPHROSTOMY W/ INTRODUCTION OF CATHETER    . TUBAL LIGATION      Family History  Problem Relation Age of Onset  . Arthritis Mother   . Hypertension Mother   . Stroke Father   . Alcohol abuse Father   . Cirrhosis Father   . Cancer Maternal Aunt   . Diabetes Son   . Pancreatic disease Brother   . Healthy Brother   . Healthy Brother   . Breast cancer Neg Hx     Social History   Socioeconomic History  . Marital status: Married    Spouse name: Not on file  . Number of children: Not on  file  . Years of education: Not on file  . Highest education level: Not on file  Social Needs  . Financial resource strain: Not on file  . Food insecurity - worry: Not on file  . Food insecurity - inability: Not on file  . Transportation needs - medical: Not on file  . Transportation needs - non-medical: Not on file  Occupational History  . Not on file  Tobacco Use  . Smoking status: Former Smoker    Packs/day: 0.50    Years: 20.00    Pack years: 10.00    Types: Cigarettes    Last attempt to quit: 02/13/2005    Years since quitting: 12.1  . Smokeless tobacco: Never Used  Substance and Sexual Activity  . Alcohol use: No  . Drug use: No  . Sexual activity: Yes  Other Topics Concern  . Not on file  Social History Narrative  . Not on file     Current Outpatient Medications:  .  enalapril (VASOTEC) 20 MG tablet, Take 40 mg by mouth daily. , Disp: , Rfl:  .  glipiZIDE (GLUCOTROL XL) 10 MG 24 hr tablet, Take 10 mg by mouth daily with breakfast. , Disp: , Rfl:  .  HYDROcodone-acetaminophen (NORCO/VICODIN) 5-325 MG tablet, Take 1-2 tablets  every 4 (four) hours as needed by mouth for moderate pain. Take no more than 10 tablets daily., Disp: 30 tablet, Rfl: 0 .  Insulin Detemir (LEVEMIR FLEXPEN) 100 UNIT/ML Pen, Inject 48 Units into the skin at bedtime. , Disp: , Rfl:  .  JANUVIA 100 MG tablet, Take 100 mg by mouth daily. , Disp: , Rfl:  .  metFORMIN (GLUCOPHAGE) 500 MG tablet, Take 1,000 mg by mouth 2 (two) times daily with a meal. , Disp: , Rfl:  .  Multiple Vitamins-Minerals (MULTIVITAMIN WITH MINERALS) tablet, Take 1 tablet daily by mouth., Disp: , Rfl:  .  naproxen (NAPROSYN) 500 MG tablet, TAKE 1 TABLET BY MOUTH TWICE DAILY WITH A MEAL (Patient taking differently: TAKE 1 TABLET BY MOUTH TWICE DAILY AS NEEDED WITH A MEAL), Disp: 30 tablet, Rfl: 5  No Known Allergies   Review of Systems  Constitutional: Negative.   HENT: Negative.   Eyes: Negative.   Respiratory: Negative.    Cardiovascular: Negative.   Gastrointestinal: Negative.   Genitourinary: Negative.   Musculoskeletal: Negative.   Skin: Negative.   Neurological: Negative.   Endo/Heme/Allergies: Negative.   Psychiatric/Behavioral: Negative.    Objective  Vitals:   03/26/17 1102  BP: 120/60  Pulse: 82  Resp: 16  Temp: 98 F (36.7 C)  TempSrc: Oral  Weight: 202 lb (91.6 kg)  Height: '5\' 5"'  (1.651 m)    Physical Exam  Constitutional: She is oriented to person, place, and time and well-developed, well-nourished, and in no distress.  HENT:  Head: Normocephalic and atraumatic.  Right Ear: Hearing, tympanic membrane, external ear and ear canal normal.  Left Ear: Hearing, tympanic membrane, external ear and ear canal normal.  Nose: Nose normal.  Mouth/Throat: Uvula is midline, oropharynx is clear and moist and mucous membranes are normal.  Eyes: Conjunctivae and EOM are normal. Pupils are equal, round, and reactive to light.  Neck: Normal range of motion. Neck supple. Carotid bruit is not present.  Cardiovascular: Normal rate, regular rhythm and intact distal pulses.  Murmur heard.  Systolic murmur is present with a grade of 2/6. Pulmonary/Chest: Effort normal and breath sounds normal. No respiratory distress.  Abdominal: Soft. Bowel sounds are normal.  Musculoskeletal: Normal range of motion.  Neurological: She is alert and oriented to person, place, and time. She has normal reflexes. Gait normal. GCS score is 15.  Skin: Skin is warm and dry.  Psychiatric: Mood, memory, affect and judgment normal.   Diabetic Foot Exam - Simple   Simple Foot Form Diabetic Foot exam was performed with the following findings:  Yes 03/26/2017 11:38 AM  Visual Inspection No deformities, no ulcerations, no other skin breakdown bilaterally:  Yes Sensation Testing Intact to touch and monofilament testing bilaterally:  Yes Pulse Check Posterior Tibialis and Dorsalis pulse intact bilaterally:  Yes Comments      Recent Results (from the past 2160 hour(s))  CBC     Status: Abnormal   Collection Time: 12/27/16 10:43 AM  Result Value Ref Range   WBC 7.4 3.6 - 11.0 K/uL   RBC 4.61 3.80 - 5.20 MIL/uL   Hemoglobin 13.1 12.0 - 16.0 g/dL   HCT 40.8 35.0 - 47.0 %   MCV 88.5 80.0 - 100.0 fL   MCH 28.4 26.0 - 34.0 pg   MCHC 32.0 32.0 - 36.0 g/dL   RDW 14.6 (H) 11.5 - 14.5 %   Platelets 147 (L) 150 - 440 K/uL  Basic metabolic panel     Status: Abnormal  Collection Time: 12/27/16 10:43 AM  Result Value Ref Range   Sodium 136 135 - 145 mmol/L   Potassium 4.4 3.5 - 5.1 mmol/L   Chloride 106 101 - 111 mmol/L   CO2 20 (L) 22 - 32 mmol/L   Glucose, Bld 178 (H) 65 - 99 mg/dL   BUN 23 (H) 6 - 20 mg/dL   Creatinine, Ser 0.65 0.44 - 1.00 mg/dL   Calcium 9.4 8.9 - 10.3 mg/dL   GFR calc non Af Amer >60 >60 mL/min   GFR calc Af Amer >60 >60 mL/min    Comment: (NOTE) The eGFR has been calculated using the CKD EPI equation. This calculation has not been validated in all clinical situations. eGFR's persistently <60 mL/min signify possible Chronic Kidney Disease.    Anion gap 10 5 - 15  Glucose, capillary     Status: Abnormal   Collection Time: 12/29/16  1:43 PM  Result Value Ref Range   Glucose-Capillary 171 (H) 65 - 99 mg/dL  I-STAT 4, (NA,K, GLUC, HGB,HCT)     Status: Abnormal   Collection Time: 12/29/16  2:17 PM  Result Value Ref Range   Sodium 141 135 - 145 mmol/L   Potassium 4.2 3.5 - 5.1 mmol/L   Glucose, Bld 168 (H) 65 - 99 mg/dL   HCT 41.0 36.0 - 46.0 %   Hemoglobin 13.9 12.0 - 15.0 g/dL  Surgical pathology     Status: None   Collection Time: 12/29/16  3:27 PM  Result Value Ref Range   SURGICAL PATHOLOGY      Surgical Pathology CASE: [id number] PATIENT: Esmay Jelinek Surgical Pathology Report     SPECIMEN SUBMITTED: A. Mass, left posterior axillary chest wall  CLINICAL HISTORY: None provided  PRE-OPERATIVE DIAGNOSIS: Left lateral chest wall mass  POST-OPERATIVE  DIAGNOSIS: Same as pre-op     DIAGNOSIS: A. MASS, LEFT POSTERIOR AXILLARY CHEST WALL; EXCISION: - MATURE ADIPOSE TISSUE COMPATIBLE WITH LIPOMA.   GROSS DESCRIPTION:  A. Labeled: Left posterior axillary chest wall mass  Tissue fragment(s): 1  Size: 11.5 x 8.0 x 6.0 cm  Description: An irregular fragment of tan- yellow adipose tissue with an overlying 11.0 x 5.7 cm elliptical excision of brown, grossly unremarkable skin. A suture designates portion extending into axilla, arbitrarily designated as 12:00 and inked orange. The 12-3-6:00 aspect is inked blue, and the 07-23-10:00 aspect is inked black. Sectioning displays a tan-yellow, fatty cut surface with no distinct abnormalities or mass lesion s grossly identified.  Representative sections are submitted from 12:00 to 6:00 in cassettes 1-4.     Final Diagnosis performed by Quay Burow, MD.  Electronically signed 01/02/2017 11:37:40AM    The electronic signature indicates that the named Attending Pathologist has evaluated the specimen  Technical component performed at [facility], [address], [place], [place] [address] Lab: [phone] Dir: Rush Farmer, MD, MMM  Professional component performed at [facility], [facility], [place], [place], [place] [address] Lab: [phone] Dir: Dellia Nims. Rubinas, MD    Glucose, capillary     Status: Abnormal   Collection Time: 12/29/16  3:56 PM  Result Value Ref Range   Glucose-Capillary 160 (H) 65 - 99 mg/dL  HM DIABETES EYE EXAM     Status: None   Collection Time: 01/26/17 12:00 AM  Result Value Ref Range   HM Diabetic Eye Exam No Retinopathy No Retinopathy     Assessment & Plan  Problem List Items Addressed This Visit      Cardiovascular and  Mediastinum   Benign essential HTN   Relevant Orders   CBC with Differential/Platelet   Comprehensive metabolic panel   Lipid panel   TSH     Endocrine   Type 2 diabetes mellitus (HCC)   Relevant  Orders   CBC with Differential/Platelet   Comprehensive metabolic panel   Lipid panel   TSH     Genitourinary   Chronic kidney disease   Relevant Orders   CBC with Differential/Platelet   Comprehensive metabolic panel   Lipid panel   TSH     Other   Pure hypercholesterolemia   Relevant Orders   CBC with Differential/Platelet   Comprehensive metabolic panel   Lipid panel   TSH    Other Visit Diagnoses    Annual physical exam    -  Primary   Need for pneumococcal vaccination       Relevant Orders   Pneumococcal polysaccharide vaccine 23-valent greater than or equal to 2yo subcutaneous/IM   Screening-pulmonary TB       Relevant Orders   Quantiferon tb gold assay     1. Annual physical exam Normal physical exam today. Will check labs as below and f/u pending lab results. If labs are stable and WNL she will not need to have these rechecked for one year at her next annual physical exam. She is to call the office in the meantime if she has any acute issue, questions or concerns.  2. Benign essential HTN Stable. Followed by Cardiology. Continue current medical treatment plan. Will check labs as below and f/u pending results. - CBC with Differential/Platelet - Comprehensive metabolic panel - Lipid panel - TSH  3. Type 2 diabetes mellitus without complication, without long-term current use of insulin (HCC) Stable. Followed by Endocrinology. Will check labs as below and f/u pending results. - CBC with Differential/Platelet - Comprehensive metabolic panel - Lipid panel - TSH  4. Pure hypercholesterolemia Stable. Followed by Cardiology. Will check labs as below and f/u pending results. - CBC with Differential/Platelet - Comprehensive metabolic panel - Lipid panel - TSH  5. Stage 2 chronic kidney disease Improved. Labs normal at last check. Will check labs as below and f/u pending results. - CBC with Differential/Platelet - Comprehensive metabolic panel - Lipid panel -  TSH  6. Need for pneumococcal vaccination Pneumococcal 23 Vaccine given to patient without complications. Patient sat for 15 minutes after administration and was tolerated well without adverse effects. - Pneumococcal polysaccharide vaccine 23-valent greater than or equal to 2yo subcutaneous/IM  7. Screening-pulmonary TB - Quantiferon tb gold assay

## 2017-03-27 ENCOUNTER — Telehealth: Payer: Self-pay

## 2017-03-27 DIAGNOSIS — E7800 Pure hypercholesterolemia, unspecified: Secondary | ICD-10-CM

## 2017-03-27 LAB — LIPID PANEL
CHOLESTEROL TOTAL: 199 mg/dL (ref 100–199)
Chol/HDL Ratio: 5.5 ratio — ABNORMAL HIGH (ref 0.0–4.4)
HDL: 36 mg/dL — ABNORMAL LOW (ref >39)
LDL Calculated: 108 mg/dL — ABNORMAL HIGH (ref 0–99)
Triglycerides: 274 mg/dL — ABNORMAL HIGH (ref 0–149)
VLDL Cholesterol Cal: 55 mg/dL — ABNORMAL HIGH (ref 5–40)

## 2017-03-27 LAB — COMPREHENSIVE METABOLIC PANEL
A/G RATIO: 1.3 (ref 1.2–2.2)
ALK PHOS: 62 IU/L (ref 39–117)
ALT: 14 IU/L (ref 0–32)
AST: 16 IU/L (ref 0–40)
Albumin: 4.2 g/dL (ref 3.6–4.8)
BUN / CREAT RATIO: 18 (ref 12–28)
BUN: 15 mg/dL (ref 8–27)
Bilirubin Total: 0.2 mg/dL (ref 0.0–1.2)
CHLORIDE: 100 mmol/L (ref 96–106)
CO2: 18 mmol/L — ABNORMAL LOW (ref 20–29)
Calcium: 9.6 mg/dL (ref 8.7–10.3)
Creatinine, Ser: 0.85 mg/dL (ref 0.57–1.00)
GFR calc non Af Amer: 71 mL/min/{1.73_m2} (ref >59)
GFR, EST AFRICAN AMERICAN: 82 mL/min/{1.73_m2} (ref >59)
Globulin, Total: 3.2 g/dL (ref 1.5–4.5)
Glucose: 315 mg/dL — ABNORMAL HIGH (ref 65–99)
POTASSIUM: 4.6 mmol/L (ref 3.5–5.2)
Sodium: 136 mmol/L (ref 134–144)
TOTAL PROTEIN: 7.4 g/dL (ref 6.0–8.5)

## 2017-03-27 LAB — CBC WITH DIFFERENTIAL/PLATELET
BASOS ABS: 0 10*3/uL (ref 0.0–0.2)
BASOS: 0 %
EOS (ABSOLUTE): 0.1 10*3/uL (ref 0.0–0.4)
Eos: 1 %
Hematocrit: 38.9 % (ref 34.0–46.6)
Hemoglobin: 12.8 g/dL (ref 11.1–15.9)
IMMATURE GRANS (ABS): 0 10*3/uL (ref 0.0–0.1)
Immature Granulocytes: 0 %
LYMPHS ABS: 3.1 10*3/uL (ref 0.7–3.1)
LYMPHS: 30 %
MCH: 28.5 pg (ref 26.6–33.0)
MCHC: 32.9 g/dL (ref 31.5–35.7)
MCV: 87 fL (ref 79–97)
MONOS ABS: 0.5 10*3/uL (ref 0.1–0.9)
Monocytes: 5 %
NEUTROS ABS: 6.7 10*3/uL (ref 1.4–7.0)
Neutrophils: 64 %
PLATELETS: 180 10*3/uL (ref 150–379)
RBC: 4.49 x10E6/uL (ref 3.77–5.28)
RDW: 13.7 % (ref 12.3–15.4)
WBC: 10.4 10*3/uL (ref 3.4–10.8)

## 2017-03-27 LAB — TSH: TSH: 1.16 u[IU]/mL (ref 0.450–4.500)

## 2017-03-27 NOTE — Telephone Encounter (Signed)
-----   Message from Mar Daring, [place] sent at 03/27/2017 12:01 PM EST ----- Cholesterol is elevated compared to last year and sugar was 315. These are not fasting labs so cholesterol reading is probably stable when compared to last year. Sugar, however, is quite elevated even for a nonfasting reading. Make sure to be taking medications as prescribed by Dr. Gabriel Carina. If interested I do think you would benefit from a cholesterol lowering medication since you have diabetes to help reduce risk of heart disease further. The TB test is still pending. I will notify you of that result once received.

## 2017-03-27 NOTE — Telephone Encounter (Signed)
Patient advised as below. Patient agreed to starting cholesterol medication.

## 2017-03-28 ENCOUNTER — Other Ambulatory Visit: Payer: Self-pay | Admitting: Physician Assistant

## 2017-03-28 DIAGNOSIS — M7581 Other shoulder lesions, right shoulder: Secondary | ICD-10-CM

## 2017-03-28 MED ORDER — NAPROXEN 500 MG PO TABS: ORAL_TABLET | ORAL | 5 refills | Status: DC

## 2017-03-28 MED ORDER — ATORVASTATIN CALCIUM 10 MG PO TABS: 10.0000 mg | ORAL_TABLET | Freq: Every day | ORAL | 3 refills | Status: DC

## 2017-03-28 NOTE — Telephone Encounter (Signed)
Patient is requesting a refill on the following medication  naproxen (NAPROSYN) 500 MG tablet  She uses [facility] on [address].

## 2017-03-28 NOTE — Telephone Encounter (Signed)
Sent in

## 2017-03-29 LAB — QUANTIFERON-TB GOLD PLUS
QUANTIFERON NIL VALUE: 0.02 [IU]/mL
QUANTIFERON-TB GOLD PLUS: NEGATIVE
QuantiFERON TB1 Ag Value: 0.02 IU/mL
QuantiFERON TB2 Ag Value: 0.03 IU/mL

## 2017-03-30 ENCOUNTER — Telehealth: Payer: Self-pay

## 2017-03-30 NOTE — Telephone Encounter (Signed)
LMTCB  Thanks,  -Joseline 

## 2017-03-30 NOTE — Telephone Encounter (Signed)
Patient advised as directed below. Patient is going to come to pick up a copy on Monday.Lab results copy printed and placed up front ready for pick up.  Thanks,  -Janiaya Ryser

## 2017-03-30 NOTE — Telephone Encounter (Signed)
-----   Message from Mar Daring, [place] sent at 03/29/2017  5:15 PM EST ----- quantiferon gold testing for TB is negative. We can print if patient needs for her records for her employer.

## 2017-06-11 DIAGNOSIS — R809 Proteinuria, unspecified: Secondary | ICD-10-CM | POA: Diagnosis not present

## 2017-06-11 DIAGNOSIS — Z794 Long term (current) use of insulin: Secondary | ICD-10-CM | POA: Diagnosis not present

## 2017-06-11 DIAGNOSIS — E1129 Type 2 diabetes mellitus with other diabetic kidney complication: Secondary | ICD-10-CM | POA: Diagnosis not present

## 2017-06-18 DIAGNOSIS — E782 Mixed hyperlipidemia: Secondary | ICD-10-CM | POA: Diagnosis not present

## 2017-06-18 DIAGNOSIS — Z794 Long term (current) use of insulin: Secondary | ICD-10-CM | POA: Diagnosis not present

## 2017-06-18 DIAGNOSIS — E1129 Type 2 diabetes mellitus with other diabetic kidney complication: Secondary | ICD-10-CM | POA: Diagnosis not present

## 2017-06-18 DIAGNOSIS — R809 Proteinuria, unspecified: Secondary | ICD-10-CM | POA: Diagnosis not present

## 2017-07-11 DIAGNOSIS — E1165 Type 2 diabetes mellitus with hyperglycemia: Secondary | ICD-10-CM | POA: Diagnosis not present

## 2017-07-11 DIAGNOSIS — Z794 Long term (current) use of insulin: Secondary | ICD-10-CM | POA: Diagnosis not present

## 2017-07-11 DIAGNOSIS — E1129 Type 2 diabetes mellitus with other diabetic kidney complication: Secondary | ICD-10-CM | POA: Diagnosis not present

## 2017-07-11 DIAGNOSIS — R809 Proteinuria, unspecified: Secondary | ICD-10-CM | POA: Diagnosis not present

## 2017-08-14 DIAGNOSIS — E1165 Type 2 diabetes mellitus with hyperglycemia: Secondary | ICD-10-CM | POA: Diagnosis not present

## 2017-08-14 DIAGNOSIS — E782 Mixed hyperlipidemia: Secondary | ICD-10-CM | POA: Insufficient documentation

## 2017-08-14 DIAGNOSIS — Z794 Long term (current) use of insulin: Secondary | ICD-10-CM | POA: Diagnosis not present

## 2017-08-14 DIAGNOSIS — R809 Proteinuria, unspecified: Secondary | ICD-10-CM | POA: Diagnosis not present

## 2017-08-14 DIAGNOSIS — E1129 Type 2 diabetes mellitus with other diabetic kidney complication: Secondary | ICD-10-CM | POA: Diagnosis not present

## 2017-08-23 ENCOUNTER — Encounter: Payer: Medicare Other | Attending: Physician Assistant | Admitting: Dietician

## 2017-08-23 ENCOUNTER — Encounter: Payer: Self-pay | Admitting: Dietician

## 2017-08-23 VITALS — Ht 65.5 in | Wt 193.9 lb

## 2017-08-23 DIAGNOSIS — Z713 Dietary counseling and surveillance: Secondary | ICD-10-CM | POA: Diagnosis not present

## 2017-08-23 DIAGNOSIS — Z794 Long term (current) use of insulin: Secondary | ICD-10-CM

## 2017-08-23 DIAGNOSIS — I10 Essential (primary) hypertension: Secondary | ICD-10-CM | POA: Diagnosis not present

## 2017-08-23 DIAGNOSIS — E119 Type 2 diabetes mellitus without complications: Secondary | ICD-10-CM | POA: Diagnosis not present

## 2017-08-23 NOTE — Progress Notes (Signed)
Medical Nutrition Therapy: Visit start time: 1500  end time: 1600  Assessment:  Diagnosis: Type 2 diabetes Past medical history: HTN, HLD Psychosocial issues/ stress concerns: none  Preferred learning method:  . Auditory . Visual  Current weight: 193.9lbs Height: 5'5.5" Medications, supplements: reconciled list in medical record  Progress and evaluation: Patient reports attending diabetes class after initial diagnosis about 2008. Her BG control has worsened recently, and she has begun taking insulin with meals. She states she needs help with ensuring appropriate food portions and choices, label reading. Wants to lose weight.  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   Dietary Intake:  Usual eating pattern includes 3 meals and 1-2 snacks per day. Dining out frequency: 2 meals per week.  Breakfast: 2 egg whites, yogurt and/or fruit, occasionally coffee Snack: veg or nuts or none Lunch: Healthy Choice meal Snack: carrots or celery or nuts Supper: meat and low-carb veg.  Snack: none or same as pm Beverages: water, diet soda once daily, occasional coffee  Nutrition Care Education: Topics covered: diabetes, weight control Basic nutrition: reviewed basic food groups, appropriate nutrient balance and healthy carb and protein choices.    Weight control: benefits of weight control, importance of choosing lowfat and low sugar foods, portion control, minimal carb and protein needs as well as healthy balance of macronutrients; role of physical activity.  Diabetes: appropriate carb intake and balance, food label reading   Nutritional Diagnosis:  [place]-2.2 Altered nutrition-related laboratory As related to diabetes.  As evidenced by patient with recent HbA1C of 11.8% . -3.3 Overweight/obesity As related to excess calories.  As evidenced by patient with BMI of 31, patient reported dietary intake.  Intervention:  Instruction as noted above.   Set goals with direction from patient.    Patient does not feel she needs follow-up at this time; will schedule later if needed.   Education Materials given:  . General diet guidelines for Diabetes . Plate Planner with food lists . Goals/ instructions   Learner/ who was taught:  . Patient    Level of understanding: Marland Kitchen Verbalizes/ demonstrates competency   Demonstrated degree of understanding via:   Teach back Learning barriers: . None  Willingness to learn/ readiness for change: . Eager, change in progress   Monitoring and Evaluation:  Dietary intake, exercise, BG control, and body weight      follow up: prn

## 2017-08-23 NOTE — Patient Instructions (Addendum)
   Check food labels for carbs, aim for 30-45grams each meal.   Goal for protein is 60-90grams daily, or average 20-30grams each meal.  Consider tracking food intake, keep food diary to stay aware of intake to help with weight loss.   Continue to stay active, and/or add some additional walking or light exercise to burn more calories daily.

## 2017-08-30 ENCOUNTER — Ambulatory Visit (INDEPENDENT_AMBULATORY_CARE_PROVIDER_SITE_OTHER): Payer: Medicare Other | Admitting: Urology

## 2017-08-30 ENCOUNTER — Encounter: Payer: Self-pay | Admitting: Urology

## 2017-08-30 ENCOUNTER — Other Ambulatory Visit: Payer: Self-pay

## 2017-08-30 VITALS — BP 118/75 | HR 97 | Wt 194.0 lb

## 2017-08-30 DIAGNOSIS — D1771 Benign lipomatous neoplasm of kidney: Secondary | ICD-10-CM | POA: Diagnosis not present

## 2017-08-30 NOTE — Progress Notes (Signed)
08/30/2017 12:01 PM   Julie [name] [birth year] [id number]  Referring provider: Mar Daring, PA-C [place], [place] [address]  Chief Complaint  Patient presents with  . 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  At her visit last year ultrasound showed subcapsular fluid felt consistent with a small hematoma.  A CT showed a large left subcapsular hematoma concerning for an active bleed from her AML. She underwent left renal AML embolization by interventional radiology at [facility] on 09/22/2017.  She was seen in follow-up by IR on 11/04/2026 which showed decreased size of the left renal AML and subcapsular hematoma.  She currently has no complaints.  She denies flank/abdominal pain.  She has no gross hematuria.  She denies bothersome lower urinary tract symptoms.   PMH: Past Medical History:  Diagnosis Date  . Anemia   . Arthritis   . Chronic kidney disease   . Diabetes mellitus without complication ([place])   . Diabetic acidosis ([place])   . Hepatitis    hepatitis c, treated  . History of peptic ulcer disease   . Hypertension   . Microalbuminuria     Surgical History: Past Surgical History:  Procedure Laterality Date  . ABDOMINAL HYSTERECTOMY  1985  . COLONOSCOPY WITH PROPOFOL N/A 07/31/2016   Procedure: COLONOSCOPY WITH PROPOFOL;  Surgeon: Lollie Sails, MD;  Location: [facility] ENDOSCOPY;  Service: Endoscopy;  Laterality: N/A;  . EXCISION MASS NECK Left 12/29/2016   Procedure: CHEST WALL MASS EXCISION;  Surgeon: Robert Bellow, MD;  Location: [facility] ORS;  Service: General;  Laterality: Left;  . LAPAROSCOPIC HYSTERECTOMY     assisted vaginal hysterectomy, fibroids, ovaries removed  . LIVER BIOPSY  2009   + hepatitis C, referred to [facility] by Gustavo Lah 04/2008.  Marland Kitchen NEPHROSTOMY W/ INTRODUCTION OF CATHETER    . TUBAL LIGATION      Home Medications:  Allergies as of 08/30/2017   No Known Allergies     Medication List          Accurate as of 08/30/17 11:59 PM. Always use your most recent med list.          atorvastatin 10 MG tablet Commonly known as:  LIPITOR Take 1 tablet (10 mg total) by mouth daily.   enalapril 20 MG tablet Commonly known as:  VASOTEC Take 40 mg by mouth daily.   FREESTYLE LIBRE 14 DAY SENSOR Misc Use 1 kit every 14 (fourteen) days   GLUCOTROL XL 10 MG 24 hr tablet Generic drug:  glipiZIDE Take 10 mg by mouth daily with breakfast.   JANUVIA 100 MG tablet Generic drug:  sitaGLIPtin Take 100 mg by mouth daily.   LEVEMIR FLEXPEN 100 UNIT/ML Pen Generic drug:  Insulin Detemir Inject 40 Units into the skin at bedtime.   metFORMIN 500 MG tablet Commonly known as:  GLUCOPHAGE Take 1,000 mg by mouth 2 (two) times daily with a meal.   multivitamin with minerals tablet Take 1 tablet daily by mouth.   naproxen 500 MG tablet Commonly known as:  NAPROSYN TAKE 1 TABLET BY MOUTH TWICE DAILY WITH A MEAL   NOVOLOG FLEXPEN 100 UNIT/ML FlexPen Generic drug:  insulin aspart Take 10 units breakfast, 15 units before lunch, and 20 units before supper   ONETOUCH VERIO test strip Generic drug:  glucose blood       Allergies: No Known Allergies  Family History: Family History  Problem Relation Age of Onset  .  Arthritis Mother   . Hypertension Mother   . Stroke Father   . Alcohol abuse Father   . Cirrhosis Father   . Cancer Maternal Aunt   . Diabetes Son   . Pancreatic disease Brother   . Healthy Brother   . Healthy Brother   . Breast cancer Neg Hx     Social History:  reports that she quit smoking about 12 years ago. Her smoking use included cigarettes. She has a 10.00 pack-year smoking history. She has never used smokeless tobacco. She reports that she does not drink alcohol or use drugs.  ROS: UROLOGY Frequent Urination?: No Hard to postpone urination?: No Burning/pain with urination?: No Get up at night to urinate?: No Leakage of urine?: No Urine stream  starts and stops?: No Trouble starting stream?: No Do you have to strain to urinate?: No Blood in urine?: No Urinary tract infection?: No Sexually transmitted disease?: No Injury to kidneys or bladder?: No Painful intercourse?: No Weak stream?: No Currently pregnant?: No Vaginal bleeding?: No Last menstrual period?: n  Gastrointestinal Nausea?: No Vomiting?: No Indigestion/heartburn?: No Diarrhea?: No Constipation?: No  Constitutional Fever: No Night sweats?: No Weight loss?: No Fatigue?: No  Skin Skin rash/lesions?: No Itching?: No  Eyes Blurred vision?: No Double vision?: No  Ears/Nose/Throat Sore throat?: No Sinus problems?: No  Hematologic/Lymphatic Swollen glands?: No Easy bruising?: No  Cardiovascular Leg swelling?: No Chest pain?: No  Respiratory Cough?: No Shortness of breath?: No  Endocrine Excessive thirst?: No  Musculoskeletal Back pain?: No Joint pain?: No  Neurological Headaches?: No Dizziness?: No  Psychologic Depression?: No Anxiety?: No  Physical Exam: BP 118/75   Pulse 97   Wt 194 lb (88 kg)   BMI 31.79 kg/m   Constitutional:  Alert and oriented, No acute distress. HEENT: [place] AT, moist mucus membranes.  Trachea midline, no masses. Cardiovascular: No clubbing, cyanosis, or edema. Respiratory: Normal respiratory effort, no increased work of breathing. GI: Abdomen is soft, nontender, nondistended, no abdominal masses GU: No CVA tenderness Lymph: No cervical or inguinal lymphadenopathy. Skin: No rashes, bruises or suspicious lesions. Neurologic: Grossly intact, no focal deficits, moving all 4 extremities. Psychiatric: Normal mood and affect.   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  I messaged Dr. Joellen [name] at [facility] regarding recommended follow-up imaging and will contact the patient.   [name], [place] [address] [place], [place] [address] [phone]

## 2017-08-31 ENCOUNTER — Encounter: Payer: Self-pay | Admitting: Urology

## 2017-10-10 DIAGNOSIS — E1165 Type 2 diabetes mellitus with hyperglycemia: Secondary | ICD-10-CM | POA: Diagnosis not present

## 2017-10-17 DIAGNOSIS — E1129 Type 2 diabetes mellitus with other diabetic kidney complication: Secondary | ICD-10-CM | POA: Diagnosis not present

## 2017-10-17 DIAGNOSIS — R809 Proteinuria, unspecified: Secondary | ICD-10-CM | POA: Diagnosis not present

## 2017-10-17 DIAGNOSIS — Z794 Long term (current) use of insulin: Secondary | ICD-10-CM | POA: Diagnosis not present

## 2017-10-17 DIAGNOSIS — E1165 Type 2 diabetes mellitus with hyperglycemia: Secondary | ICD-10-CM | POA: Diagnosis not present

## 2017-11-12 ENCOUNTER — Telehealth: Payer: Self-pay | Admitting: Physician Assistant

## 2017-11-12 NOTE — Telephone Encounter (Signed)
Pt called saying she has a medical form from [facility] saying that she is healthy enough to drive.   She is going to drop it off today and needs it back asap.  Thanks [employer]

## 2017-11-12 NOTE — Telephone Encounter (Signed)
Pt dropped off DMV forms stating the DMV is requesting more information. Pt is requesting the forms be completed before 11/18/17 because that is when the [facility] is requesting the forms to be submitted per pt. Forms placed in provider's box. Please advise. Thanks TNP

## 2017-11-13 NOTE — Telephone Encounter (Signed)
Form completed.

## 2017-12-24 IMAGING — MG MM DIGITAL DIAGNOSTIC BILAT W/ TOMO W/ CAD
8 of 12 series · 8 of 28 positions shown · non-contrast
Comparison: 02/18/2016, 06/11/2015, 05/21/2015, 02/28/2011

CLINICAL DATA: Followup of probably benign in mass in the 2 o'clock
position the left breast and annual examination of both breasts.

EXAM:
2D DIGITAL DIAGNOSTIC BILATERAL MAMMOGRAM WITH CAD AND ADJUNCT TOMO
ULTRASOUND LEFT BREAST

[L MLO]
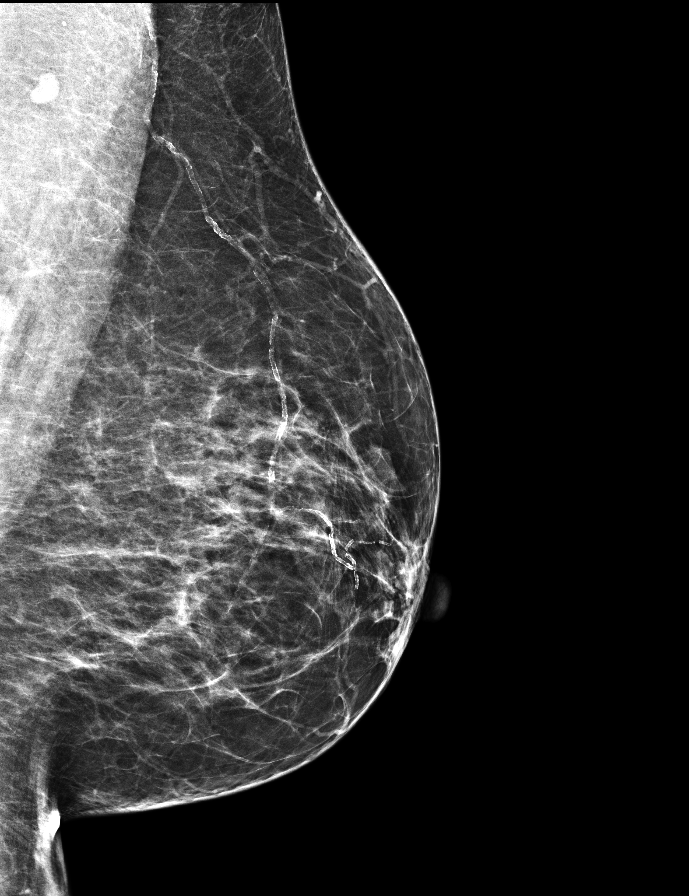

[R CC synth-2D]
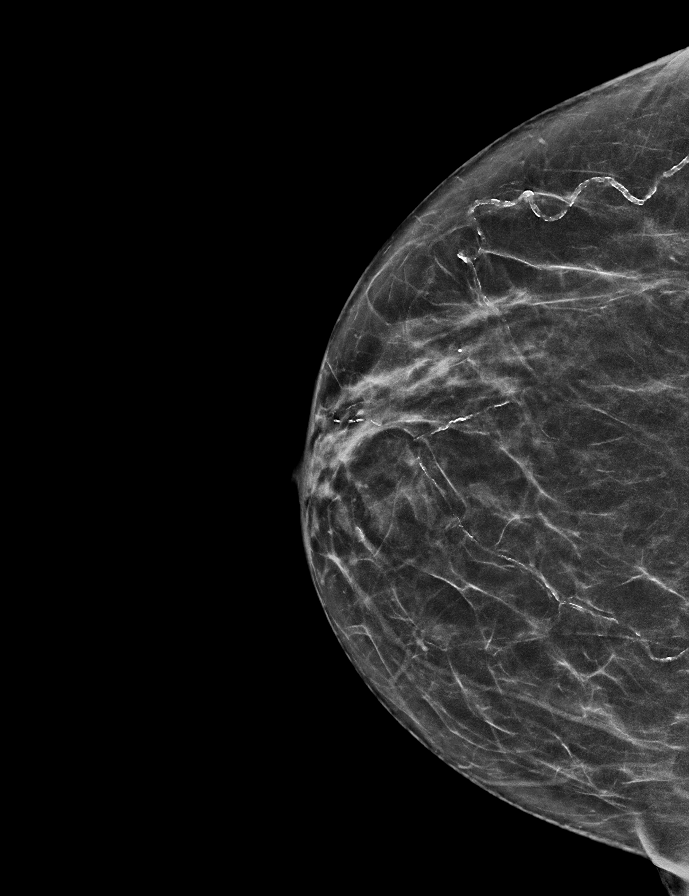

[R MLO synth-2D]
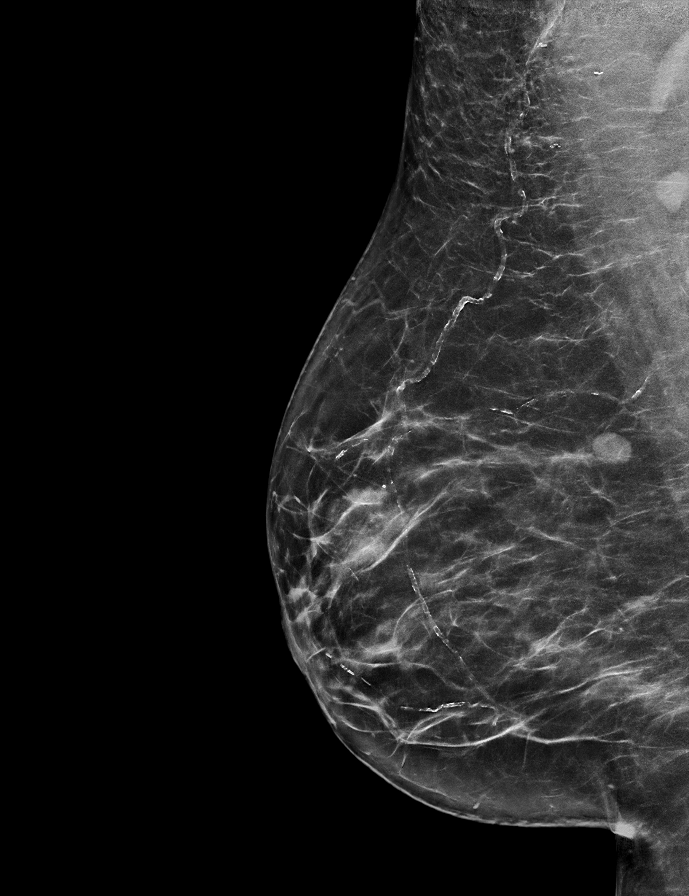

[R MLO]
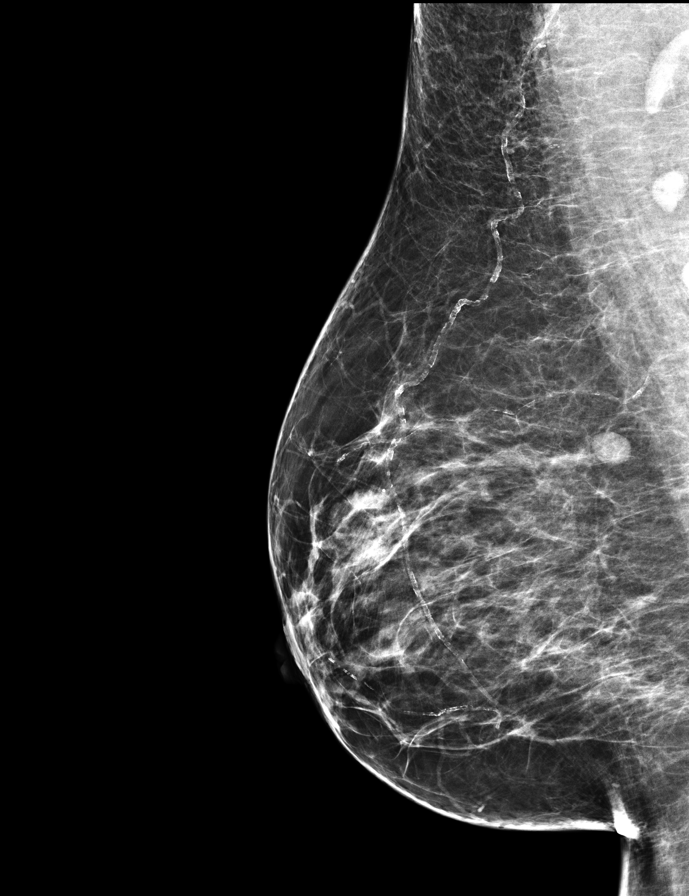

[R CC]
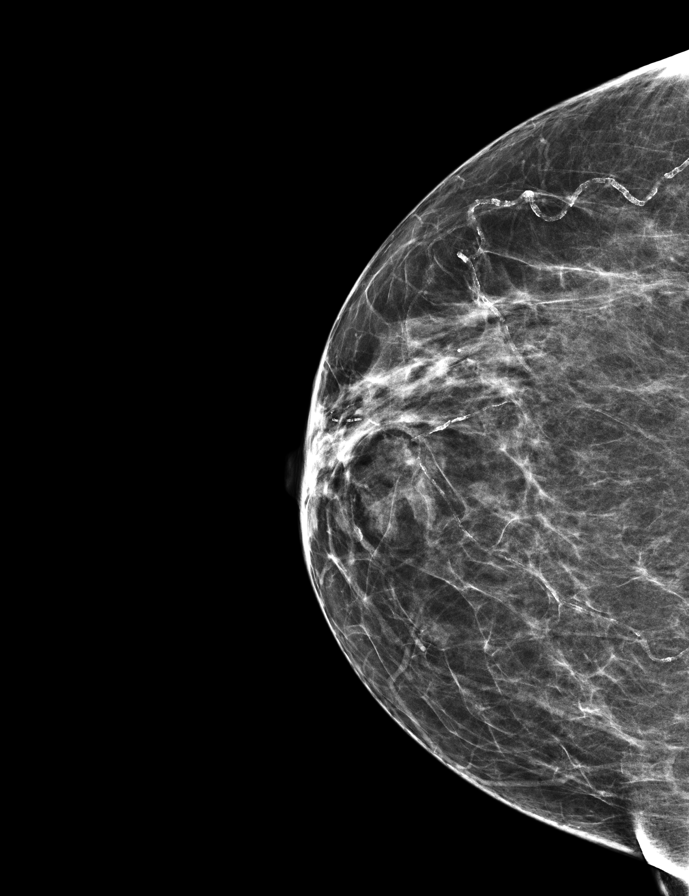

[L CC synth-2D]
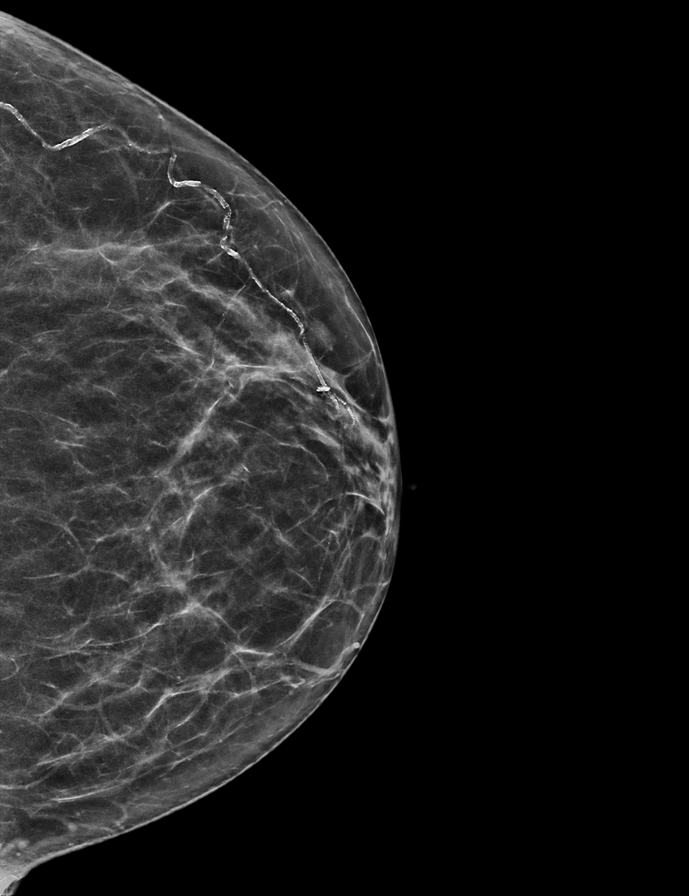

[L MLO synth-2D]
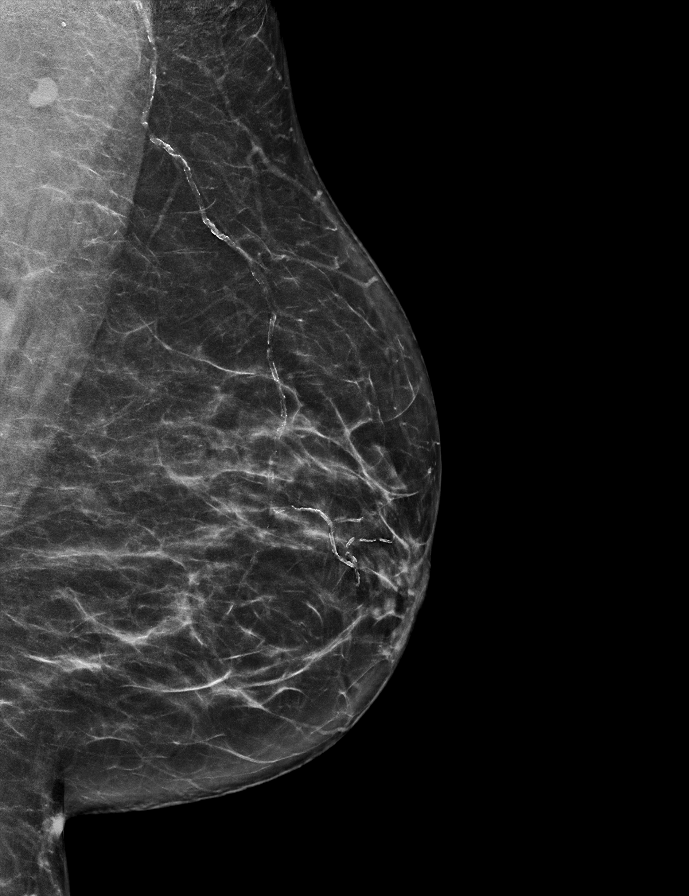

[L CC]
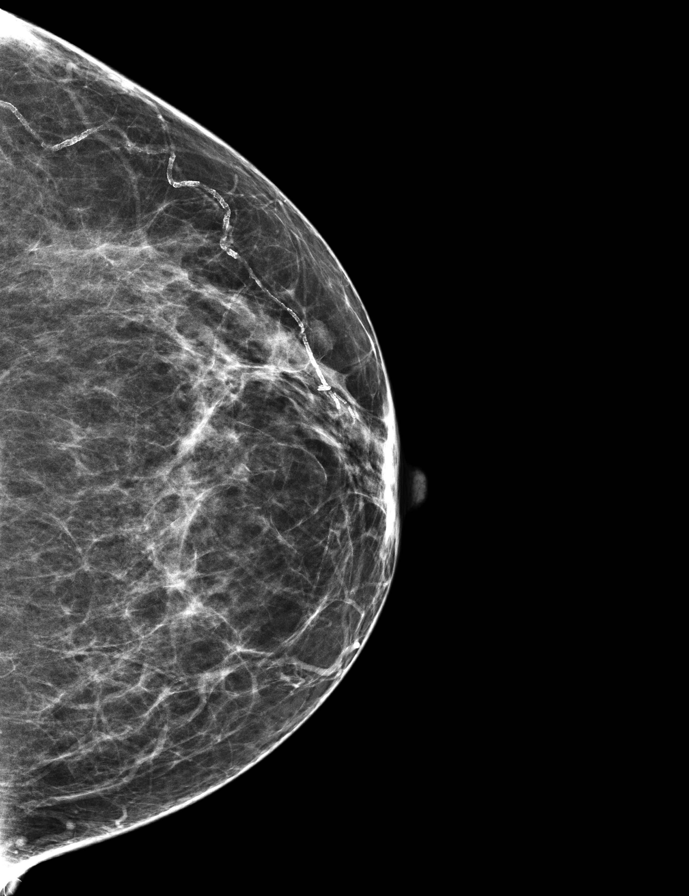

[8 of 28 positions shown; findings below may reference images not displayed]

ACR Breast Density Category b: There are scattered areas of
fibroglandular density.
FINDINGS: A circumscribed subcentimeter oval nodule in the anterior third of
the outer left breast is mammographically stable. No new or
suspicious mass is identified in either breast. There is no
architectural distortion or suspicious microcalcification
bilaterally. Bilateral atherosclerotic calcifications are noted.

Mammographic images were processed with CAD.

Targeted ultrasound is performed, showing an oval circumscribed and
gently lobulated hypoechoic mass in the 2 o'clock position of the
left breast 3 cm from the nipple measuring 0.7 x 0.7 x 0.4 cm. There
is no internal vascular flow. The mass is stable in size.
IMPRESSION: Stable probably benign circumscribed mass in the 2 o'clock position
of the left breast.

No evidence of malignancy in the right breast.

RECOMMENDATION:
Bilateral diagnostic mammogram and possible left breast ultrasound
is recommended in 1 year.

I have discussed the findings and recommendations with the patient.
Results were also provided in writing at the conclusion of the
visit. If applicable, a reminder letter will be sent to the patient
regarding the next appointment.

BI-RADS CATEGORY  3: Probably benign.

## 2018-01-09 ENCOUNTER — Other Ambulatory Visit: Payer: Self-pay | Admitting: Physician Assistant

## 2018-01-09 DIAGNOSIS — M7581 Other shoulder lesions, right shoulder: Secondary | ICD-10-CM

## 2018-01-09 DIAGNOSIS — E1165 Type 2 diabetes mellitus with hyperglycemia: Secondary | ICD-10-CM | POA: Diagnosis not present

## 2018-01-18 DIAGNOSIS — Z794 Long term (current) use of insulin: Secondary | ICD-10-CM | POA: Diagnosis not present

## 2018-01-18 DIAGNOSIS — E1129 Type 2 diabetes mellitus with other diabetic kidney complication: Secondary | ICD-10-CM | POA: Diagnosis not present

## 2018-01-18 DIAGNOSIS — E1165 Type 2 diabetes mellitus with hyperglycemia: Secondary | ICD-10-CM | POA: Diagnosis not present

## 2018-01-18 DIAGNOSIS — R809 Proteinuria, unspecified: Secondary | ICD-10-CM | POA: Diagnosis not present

## 2018-04-03 ENCOUNTER — Other Ambulatory Visit: Payer: Self-pay | Admitting: Physician Assistant

## 2018-04-03 DIAGNOSIS — E7800 Pure hypercholesterolemia, unspecified: Secondary | ICD-10-CM

## 2018-04-03 NOTE — Telephone Encounter (Signed)
Patient due for CPE

## 2018-04-12 DIAGNOSIS — J069 Acute upper respiratory infection, unspecified: Secondary | ICD-10-CM | POA: Diagnosis not present

## 2018-04-23 DIAGNOSIS — E1129 Type 2 diabetes mellitus with other diabetic kidney complication: Secondary | ICD-10-CM | POA: Diagnosis not present

## 2018-04-23 DIAGNOSIS — R809 Proteinuria, unspecified: Secondary | ICD-10-CM | POA: Diagnosis not present

## 2018-04-23 DIAGNOSIS — Z794 Long term (current) use of insulin: Secondary | ICD-10-CM | POA: Diagnosis not present

## 2018-04-23 DIAGNOSIS — E1165 Type 2 diabetes mellitus with hyperglycemia: Secondary | ICD-10-CM | POA: Diagnosis not present

## 2018-04-29 ENCOUNTER — Other Ambulatory Visit: Payer: Self-pay | Admitting: Physician Assistant

## 2018-04-29 DIAGNOSIS — E7800 Pure hypercholesterolemia, unspecified: Secondary | ICD-10-CM

## 2018-04-30 DIAGNOSIS — R809 Proteinuria, unspecified: Secondary | ICD-10-CM | POA: Diagnosis not present

## 2018-04-30 DIAGNOSIS — I10 Essential (primary) hypertension: Secondary | ICD-10-CM | POA: Diagnosis not present

## 2018-04-30 DIAGNOSIS — E1129 Type 2 diabetes mellitus with other diabetic kidney complication: Secondary | ICD-10-CM | POA: Diagnosis not present

## 2018-04-30 DIAGNOSIS — Z794 Long term (current) use of insulin: Secondary | ICD-10-CM | POA: Diagnosis not present

## 2018-05-27 ENCOUNTER — Encounter: Payer: Medicare Other | Admitting: Physician Assistant

## 2018-07-07 ENCOUNTER — Emergency Department: Payer: Medicare Other

## 2018-07-07 ENCOUNTER — Encounter: Payer: Self-pay | Admitting: Emergency Medicine

## 2018-07-07 ENCOUNTER — Other Ambulatory Visit: Payer: Self-pay

## 2018-07-07 ENCOUNTER — Emergency Department
Admission: EM | Admit: 2018-07-07 | Discharge: 2018-07-07 | Disposition: A | Discharge status: Home / Self Care | Payer: Medicare Other | Attending: Emergency Medicine | Admitting: Emergency Medicine

## 2018-07-07 DIAGNOSIS — E1122 Type 2 diabetes mellitus with diabetic chronic kidney disease: Secondary | ICD-10-CM | POA: Diagnosis not present

## 2018-07-07 DIAGNOSIS — Z79899 Other long term (current) drug therapy: Secondary | ICD-10-CM | POA: Diagnosis not present

## 2018-07-07 DIAGNOSIS — Z794 Long term (current) use of insulin: Secondary | ICD-10-CM | POA: Diagnosis not present

## 2018-07-07 DIAGNOSIS — N189 Chronic kidney disease, unspecified: Secondary | ICD-10-CM | POA: Diagnosis not present

## 2018-07-07 DIAGNOSIS — M7989 Other specified soft tissue disorders: Secondary | ICD-10-CM | POA: Diagnosis not present

## 2018-07-07 DIAGNOSIS — I129 Hypertensive chronic kidney disease with stage 1 through stage 4 chronic kidney disease, or unspecified chronic kidney disease: Secondary | ICD-10-CM | POA: Diagnosis not present

## 2018-07-07 DIAGNOSIS — Z87891 Personal history of nicotine dependence: Secondary | ICD-10-CM | POA: Insufficient documentation

## 2018-07-07 DIAGNOSIS — M79671 Pain in right foot: Secondary | ICD-10-CM | POA: Diagnosis not present

## 2018-07-07 MED ORDER — CEPHALEXIN 500 MG PO CAPS: 500.0000 mg | ORAL_CAPSULE | Freq: Three times a day (TID) | ORAL | 0 refills | Status: AC

## 2018-07-07 MED ORDER — MELOXICAM 7.5 MG PO TABS: 7.5000 mg | ORAL_TABLET | Freq: Every day | ORAL | 0 refills | Status: AC

## 2018-07-07 NOTE — Discharge Instructions (Addendum)
I want you to take Keflex 3 times a day for the next 7 days due to redness along the right foot. I am concerned that redness could be caused from a skin infection called cellulitis. If redness around foot worsens, I want you to come back to the emergency department. I would like you to take meloxicam for pain and inflammation for only 7 days.

## 2018-07-07 NOTE — ED Provider Notes (Signed)
[facility] Emergency Department Provider Note  ____________________________________________  Time seen: Approximately 5:00 PM  I have reviewed the triage vital signs and the nursing notes.   HISTORY  Chief Complaint Foot Pain    HPI Julie [name] is a 68 y.o. female with a history of diabetes, presents to the emergency department with acute right foot pain and erythema for the past 2 days.  Patient states that she noticed pain and redness along the dorsal aspect of her right foot after she was gardening.   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  She denies falls or traumas.  No recent history of cellulitis.  She denies calf pain or other right lower extremity pain/swelling.  No other alleviating measures have been attempted.        Past Medical History:  Diagnosis Date  . Anemia   . Arthritis   . Chronic kidney disease   . Diabetes mellitus without complication ([place])   . Diabetic acidosis ([place])   . Hepatitis    hepatitis c, treated  . History of peptic ulcer disease   . Hypertension   . Microalbuminuria     Patient Active Problem List   Diagnosis Date Noted  . Mixed hyperlipidemia 08/14/2017  . Chronic kidney disease 03/26/2017  . 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 Colon polyp 04/21/2015  . Benign essential HTN 04/21/2015  . Liver mass 04/21/2015  . Kidney lump 04/21/2015  . Avitaminosis D 04/21/2015  . Type 2 diabetes mellitus ([place]) 04/13/2015  . 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 Urinary, incontinence, stress female 11/18/2012  . Abnormal loss of weight 12/23/2009  . Pure hypercholesterolemia 01/26/2009  . Hepatitis C virus infection without hepatic coma 05/21/2008  . Abnormal ECG 02/09/2007  . H/O peptic ulcer 02/09/2007  . Tobacco use 02/01/2007    Past Surgical History:  Procedure Laterality Date  . ABDOMINAL HYSTERECTOMY  1985  . COLONOSCOPY WITH PROPOFOL  N/A 07/31/2016   Procedure: COLONOSCOPY WITH PROPOFOL;  Surgeon: Lollie Sails, MD;  Location: [facility] ENDOSCOPY;  Service: Endoscopy;  Laterality: N/A;  . EXCISION MASS NECK Left 12/29/2016   Procedure: CHEST WALL MASS EXCISION;  Surgeon: Robert Bellow, MD;  Location: [facility] ORS;  Service: General;  Laterality: Left;  . LAPAROSCOPIC HYSTERECTOMY     assisted vaginal hysterectomy, fibroids, ovaries removed  . LIVER BIOPSY  2009   + hepatitis C, referred to [facility] by Gustavo Lah 04/2008.  Marland Kitchen NEPHROSTOMY W/ INTRODUCTION OF CATHETER    . TUBAL LIGATION      Prior to Admission medications   Medication Sig Start Date End Date Taking? Authorizing Provider  atorvastatin (LIPITOR) 10 MG tablet TAKE 1 TABLET BY MOUTH ONCE DAILY. DUE FOR PHYSICAL APPOINTMENT. 04/29/18   Mar Daring, PA-C  cephALEXin (KEFLEX) 500 MG capsule Take 1 capsule (500 mg total) by mouth 3 (three) times daily for 7 days. 07/07/18 07/14/18  Lannie Fields, PA-C  Continuous Blood Gluc Sensor (FREESTYLE LIBRE 14 DAY SENSOR) MISC Use 1 kit every 14 (fourteen) days 08/14/17   [provider]  enalapril (VASOTEC) 20 MG tablet Take 40 mg by mouth daily.  07/15/13   [provider]  glipiZIDE (GLUCOTROL XL) 10 MG 24 hr tablet Take 10 mg by mouth daily with breakfast.  01/03/12   [provider]  insulin aspart (NOVOLOG FLEXPEN) 100 UNIT/ML FlexPen Take 10 units breakfast, 15 units before lunch, and 20  units before supper 08/14/17   [provider]  Insulin Detemir (LEVEMIR FLEXPEN) 100 UNIT/ML Pen Inject 40 Units into the skin at bedtime.     [provider]  JANUVIA 100 MG tablet Take 100 mg by mouth daily.  02/16/15   [provider]  meloxicam (MOBIC) 7.5 MG tablet Take 1 tablet (7.5 mg total) by mouth daily for 7 days. 07/07/18 07/14/18  Lannie Fields, PA-C  metFORMIN (GLUCOPHAGE) 500 MG tablet Take 1,000 mg by mouth 2 (two) times daily with a meal.  11/16/11   [provider]  Multiple Vitamins-Minerals (MULTIVITAMIN WITH MINERALS) tablet Take 1 tablet daily by mouth.    [provider]  naproxen (NAPROSYN) 500 MG tablet TAKE 1 TABLET BY MOUTH TWICE DAILY WITH MEALS 01/09/18   Burnette, Clearnce Sorrel, PA-C  [facility] VERIO test strip  06/18/17   [provider]    Allergies Patient has no known allergies.  Family History  Problem Relation Age of Onset  . Arthritis Mother   . Hypertension Mother   . Stroke Father   . Alcohol abuse Father   . Cirrhosis Father   . Cancer Maternal Aunt   . Diabetes Son   . Pancreatic disease Brother   . Healthy Brother   . Healthy Brother   . Breast cancer Neg Hx     Social History Social History   Tobacco Use  . Smoking status: Former Smoker    Packs/day: 0.50    Years: 20.00    Pack years: 10.00    Types: Cigarettes    Last attempt to quit: 02/13/2005    Years since quitting: 13.4  . Smokeless tobacco: Never Used  Substance Use Topics  . Alcohol use: No  . Drug use: No     Review of Systems  Constitutional: No fever/chills Eyes: No visual changes. No discharge ENT: No upper respiratory complaints. Cardiovascular: no chest pain. Respiratory: no cough. No SOB. Gastrointestinal: No abdominal pain.  No nausea, no vomiting.  No diarrhea.  No constipation. Musculoskeletal: Patient has right foot pain.  Skin: Negative for rash, abrasions, lacerations, ecchymosis. Neurological: Negative for headaches, focal weakness or numbness.   ____________________________________________   PHYSICAL EXAM:  VITAL SIGNS: ED Triage Vitals  Enc Vitals Group     BP 07/07/18 1235 (!) 164/70     Pulse Rate 07/07/18 1235 78     Resp 07/07/18 1235 16     Temp 07/07/18 1235 98.4 F (36.9 C)     Temp Source 07/07/18 1235 Oral     SpO2 07/07/18 1235 99 %     Weight 07/07/18 1236 207 lb (93.9 kg)     Height 07/07/18 1236 5' 5.5" (1.664 m)     Head Circumference --      Peak Flow --      Pain Score 07/07/18  1236 6     Pain Loc --      Pain Edu? --      Excl. in [place]? --      Constitutional: Alert and oriented. Well appearing and in no acute distress. Eyes: Conjunctivae are normal. PERRL. EOMI. Head: Atraumatic. Cardiovascular: Normal rate, regular rhythm. Normal S1 and S2.  Good peripheral circulation. Respiratory: Normal respiratory effort without tachypnea or retractions. Lungs CTAB. Good air entry to the bases with no decreased or absent breath sounds. Gastrointestinal: Bowel sounds 4 quadrants. Soft and nontender to palpation. No guarding or rigidity. No palpable masses. No distention. No CVA tenderness. Musculoskeletal:  Full range of motion to all extremities. No gross deformities appreciated.  Patient has no calf pain to palpation bilaterally.  No edema of the right thigh or right lower leg.  Palpable dorsalis pedis pulse bilaterally and symmetrically. Neurologic:  Normal speech and language. No gross focal neurologic deficits are appreciated.  Skin: Patient has 3 cm of circumferential erythema that extends into the toes along the dorsal aspect of the right foot. Psychiatric: Mood and affect are normal. Speech and behavior are normal. Patient exhibits appropriate insight and judgement.   ____________________________________________   LABS (all labs ordered are listed, but only abnormal results are displayed)  Labs Reviewed - No data to display ____________________________________________  EKG   ____________________________________________  RADIOLOGY I personally viewed and evaluated these images as part of my medical decision making, as well as reviewing the written report by the radiologist.  Dg Foot Complete Right  Result Date: 07/07/2018 CLINICAL DATA:  Right forefoot pain and swelling with walking. No known injury. History of diabetes. EXAM: RIGHT FOOT COMPLETE - 3+ VIEW COMPARISON:  None. FINDINGS: The mineralization and alignment are normal. There is no evidence of acute  fracture or dislocation. There is soft tissue calcification between the bases of the 2nd and 3rd proximal phalanges with surrounding soft tissue swelling. This may reflect hydroxyapatite deposition. There is scattered spurring throughout the midfoot and in the calcaneal tuberosity. Mild forefoot soft tissue swelling is present. IMPRESSION: Forefoot soft tissue swelling with soft tissue calcifications in the 2nd web space suggesting calcific bursitis. No acute osseous findings. Electronically Signed   By: Richardean Sale M.D.   On: 07/07/2018 15:12    ____________________________________________    PROCEDURES  Procedure(s) performed:    Procedures    Medications - No data to display   ____________________________________________   INITIAL IMPRESSION / ASSESSMENT AND PLAN / ED COURSE  Pertinent labs & imaging results that were available during my care of the patient were reviewed by me and considered in my medical decision making (see chart for details).  Review of the [place] CSRS was performed in accordance of the [place] prior to dispensing any controlled drugs.           Assessment and plan Right foot pain 68 year old female presents to the emergency department with acute right foot pain and erythema for the past 2 days.  On physical exam, patient was neurovascularly intact with normal sensation and palpable pulses of the lower extremities.  Patient was afebrile and vital signs are reassuring aside from hypertension noted in triage.  Patient had 3 cm of circumferential erythema along the dorsal aspect of the right foot that extended into the toes.  Differential diagnosis included right foot cellulitis versus bursitis.  Patient was treated with Keflex and meloxicam.  Patient was advised to only take meloxicam for 1 week given her history of diabetes.  She was given strict return precautions to return to the emergency department if her right foot erythema acutely worsens or if  streaking/fever occurs.  She voiced understanding.  All patient questions were answered.   ____________________________________________  FINAL CLINICAL IMPRESSION(S) / ED DIAGNOSES  Final diagnoses:  Right foot pain      NEW MEDICATIONS STARTED DURING THIS VISIT:  ED Discharge Orders         Ordered    meloxicam (MOBIC) 7.5 MG tablet  Daily     07/07/18 1656    cephALEXin (KEFLEX) 500 MG capsule  3 times daily     07/07/18 1656  This chart was dictated using voice recognition software/Dragon. Despite best efforts to proofread, errors can occur which can change the meaning. Any change was purely unintentional.    Lannie Fields, PA-C 07/07/18 1706    Earleen Newport, MD 07/07/18 1730

## 2018-07-07 NOTE — ED Triage Notes (Signed)
Pt to ED via POV c/o pain in her right foot since yesterday. Pt denies known injury. Pt is in NAD.

## 2018-08-12 DIAGNOSIS — E1129 Type 2 diabetes mellitus with other diabetic kidney complication: Secondary | ICD-10-CM | POA: Diagnosis not present

## 2018-08-12 DIAGNOSIS — Z794 Long term (current) use of insulin: Secondary | ICD-10-CM | POA: Diagnosis not present

## 2018-08-12 DIAGNOSIS — R809 Proteinuria, unspecified: Secondary | ICD-10-CM | POA: Diagnosis not present

## 2018-08-12 LAB — HEMOGLOBIN A1C
Est. average glucose Bld gHb Est-mCnc: 189
Glucose: 161
Hemoglobin A1C: 8.2

## 2018-08-19 DIAGNOSIS — Z794 Long term (current) use of insulin: Secondary | ICD-10-CM | POA: Diagnosis not present

## 2018-08-19 DIAGNOSIS — R809 Proteinuria, unspecified: Secondary | ICD-10-CM | POA: Diagnosis not present

## 2018-08-19 DIAGNOSIS — I10 Essential (primary) hypertension: Secondary | ICD-10-CM | POA: Diagnosis not present

## 2018-08-19 DIAGNOSIS — E1165 Type 2 diabetes mellitus with hyperglycemia: Secondary | ICD-10-CM | POA: Diagnosis not present

## 2018-08-19 DIAGNOSIS — E1129 Type 2 diabetes mellitus with other diabetic kidney complication: Secondary | ICD-10-CM | POA: Diagnosis not present

## 2018-08-21 ENCOUNTER — Encounter: Payer: Medicare Other | Admitting: Physician Assistant

## 2018-08-21 NOTE — Progress Notes (Deleted)
Patient: Julie [name] Female    DOB: Jun 22, 1950   68 y.o.   MRN: [id number] Visit Date: 08/21/2018  Today's Provider: Mar Daring, PA-C   No chief complaint on file.  Subjective:     HPI  No Known Allergies   Current Outpatient Medications:  .  atorvastatin (LIPITOR) 10 MG tablet, TAKE 1 TABLET BY MOUTH ONCE DAILY. DUE FOR PHYSICAL APPOINTMENT., Disp: 90 tablet, Rfl: 1 .  Continuous Blood Gluc Sensor (FREESTYLE LIBRE 14 DAY SENSOR) MISC, Use 1 kit every 14 (fourteen) days, Disp: , Rfl:  .  enalapril (VASOTEC) 20 MG tablet, Take 40 mg by mouth daily. , Disp: , Rfl:  .  glipiZIDE (GLUCOTROL XL) 10 MG 24 hr tablet, Take 10 mg by mouth daily with breakfast. , Disp: , Rfl:  .  insulin aspart (NOVOLOG FLEXPEN) 100 UNIT/ML FlexPen, Take 10 units breakfast, 15 units before lunch, and 20 units before supper, Disp: , Rfl:  .  Insulin Detemir (LEVEMIR FLEXPEN) 100 UNIT/ML Pen, Inject 40 Units into the skin at bedtime. , Disp: , Rfl:  .  JANUVIA 100 MG tablet, Take 100 mg by mouth daily. , Disp: , Rfl:  .  metFORMIN (GLUCOPHAGE) 500 MG tablet, Take 1,000 mg by mouth 2 (two) times daily with a meal. , Disp: , Rfl:  .  Multiple Vitamins-Minerals (MULTIVITAMIN WITH MINERALS) tablet, Take 1 tablet daily by mouth., Disp: , Rfl:  .  naproxen (NAPROSYN) 500 MG tablet, TAKE 1 TABLET BY MOUTH TWICE DAILY WITH MEALS, Disp: 90 tablet, Rfl: 1 .  ONETOUCH VERIO test strip, , Disp: , Rfl: 3  Review of Systems  Social History   Tobacco Use  . Smoking status: Former Smoker    Packs/day: 0.50    Years: 20.00    Pack years: 10.00    Types: Cigarettes    Quit date: 02/13/2005    Years since quitting: 13.5  . Smokeless tobacco: Never Used  Substance Use Topics  . Alcohol use: No      Objective:   There were no vitals taken for this visit. There were no vitals filed for this visit.   Physical Exam   No results found for any visits on 08/21/18.     Assessment & [place], PA-C  [facility]

## 2018-10-24 NOTE — Progress Notes (Signed)
Patient: Julie [name], Female    DOB: 1950/03/26, 68 y.o.   MRN: [id number] Visit Date: 10/25/2018  Today's Provider: Mar Daring, PA-C   Chief Complaint  Patient presents with  . Medicare Wellness   Subjective:     Annual wellness visit Julie [name] is a 68 y.o. female. She feels well. She reports exercising yes. She reports she is sleeping well. -----------------------------------------------------------   Review of Systems  Constitutional: Negative.   HENT: Negative.   Eyes: Negative.   Respiratory: Negative.   Cardiovascular: Negative.   Gastrointestinal: Negative.   Endocrine: Negative.   Genitourinary: Negative.   Musculoskeletal: Negative.   Skin: Negative.   Allergic/Immunologic: Negative.   Neurological: Negative.   Hematological: Negative.   Psychiatric/Behavioral: Negative.     Social History   Socioeconomic History  . Marital status: Married    Spouse name: Not on file  . Number of children: Not on file  . Years of education: Not on file  . Highest education level: Not on file  Occupational History  . Not on file  Social Needs  . Financial resource strain: Not on file  . Food insecurity    Worry: Not on file    Inability: Not on file  . Transportation needs    Medical: Not on file    Non-medical: Not on file  Tobacco Use  . Smoking status: Former Smoker    Packs/day: 0.50    Years: 20.00    Pack years: 10.00    Types: Cigarettes    Quit date: 02/13/2005    Years since quitting: 13.7  . Smokeless tobacco: Never Used  Substance and Sexual Activity  . Alcohol use: No  . Drug use: No  . Sexual activity: Yes  Lifestyle  . Physical activity    Days per week: Not on file    Minutes per session: Not on file  . Stress: Not on file  Relationships  . Social Herbalist on phone: Not on file    Gets together: Not on file    Attends religious service: Not on file    Active member of club or organization: Not on  file    Attends meetings of clubs or organizations: Not on file    Relationship status: Not on file  . Intimate partner violence    Fear of current or ex partner: Not on file    Emotionally abused: Not on file    Physically abused: Not on file    Forced sexual activity: Not on file  Other Topics Concern  . Not on file  Social History Narrative  . Not on file    Past Medical History:  Diagnosis Date  . Anemia   . Arthritis   . Chronic kidney disease   . Diabetes mellitus without complication ([place])   . Diabetic acidosis ([place])   . Hepatitis    hepatitis c, treated  . History of peptic ulcer disease   . Hypertension   . Microalbuminuria      Patient Active Problem List   Diagnosis Date Noted  . Mixed hyperlipidemia 08/14/2017  . Chronic kidney disease 03/26/2017  . 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 Colon polyp 04/21/2015  . Benign essential HTN 04/21/2015  . Liver mass 04/21/2015  . Kidney lump 04/21/2015  . Avitaminosis D 04/21/2015  . Type 2 diabetes mellitus ([place]) 04/13/2015  . 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 Urinary, incontinence, stress female 11/18/2012  .  Abnormal loss of weight 12/23/2009  . Pure hypercholesterolemia 01/26/2009  . Hepatitis C virus infection without hepatic coma 05/21/2008  . Abnormal ECG 02/09/2007  . H/O peptic ulcer 02/09/2007  . Tobacco use 02/01/2007    Past Surgical History:  Procedure Laterality Date  . ABDOMINAL HYSTERECTOMY  1985  . COLONOSCOPY WITH PROPOFOL N/A 07/31/2016   Procedure: COLONOSCOPY WITH PROPOFOL;  Surgeon: Lollie Sails, MD;  Location: [facility] ENDOSCOPY;  Service: Endoscopy;  Laterality: N/A;  . EXCISION MASS NECK Left 12/29/2016   Procedure: CHEST WALL MASS EXCISION;  Surgeon: Robert Bellow, MD;  Location: [facility] ORS;  Service: General;  Laterality: Left;  . LAPAROSCOPIC HYSTERECTOMY     assisted vaginal hysterectomy, fibroids, ovaries removed  . LIVER BIOPSY  2009   + hepatitis C, [facility] to [facility] by  Gustavo Lah 04/2008.  Marland Kitchen NEPHROSTOMY W/ INTRODUCTION OF CATHETER    . TUBAL LIGATION      Her family history includes Alcohol abuse in her father; Arthritis in her mother; Cancer in her maternal aunt; Cirrhosis in her father; Diabetes in her son; Healthy in her brother and brother; Hypertension in her mother; Pancreatic disease in her brother; Stroke in her father. There is no history of Breast cancer.   Current Outpatient Medications:  .  atorvastatin (LIPITOR) 10 MG tablet, TAKE 1 TABLET BY MOUTH ONCE DAILY. DUE FOR PHYSICAL APPOINTMENT., Disp: 90 tablet, Rfl: 1 .  Continuous Blood Gluc Sensor (FREESTYLE LIBRE 14 DAY SENSOR) MISC, Use 1 kit every 14 (fourteen) days, Disp: , Rfl:  .  enalapril (VASOTEC) 20 MG tablet, Take 40 mg by mouth daily. , Disp: , Rfl:  .  insulin aspart (NOVOLOG FLEXPEN) 100 UNIT/ML FlexPen, Take 10 units breakfast, 15 units before lunch, and 20 units before supper, Disp: , Rfl:  .  Insulin Detemir (LEVEMIR FLEXPEN) 100 UNIT/ML Pen, Inject 40 Units into the skin at bedtime. , Disp: , Rfl:  .  metFORMIN (GLUCOPHAGE) 500 MG tablet, Take 1,000 mg by mouth 2 (two) times daily with a meal. , Disp: , Rfl:  .  ONETOUCH VERIO test strip, , Disp: , Rfl: 3 .  glipiZIDE (GLUCOTROL XL) 10 MG 24 hr tablet, Take 10 mg by mouth daily with breakfast. , Disp: , Rfl:  .  JANUVIA 100 MG tablet, Take 100 mg by mouth daily. , Disp: , Rfl:  .  Multiple Vitamins-Minerals (MULTIVITAMIN WITH MINERALS) tablet, Take 1 tablet daily by mouth., Disp: , Rfl:  .  naproxen (NAPROSYN) 500 MG tablet, TAKE 1 TABLET BY MOUTH TWICE DAILY WITH MEALS (Patient not taking: Reported on 10/25/2018), Disp: 90 tablet, Rfl: 1  Patient Care Team: Mar Daring, PA-C as PCP - General (Family Medicine) Bary Castilla, Forest Gleason, MD as Consulting Physician (General Surgery) Mar Daring, PA-C as Referring Physician (Family Medicine)    Objective:    Vitals: BP 123/79 (BP Location: Left Arm, Patient Position:  Sitting, Cuff Size: Large)   Pulse 76   Temp (!) 97.1 F (36.2 C) (Other (Comment))   Resp 16   Ht '5\' 6"'  (1.676 m)   Wt 204 lb (92.5 kg)   SpO2 97%   BMI 32.93 kg/m   Physical Exam Vitals signs reviewed.  Constitutional:      General: She is not in acute distress.    Appearance: Normal appearance. She is well-developed. She is obese. She is not ill-appearing or diaphoretic.  HENT:     Head: Normocephalic and atraumatic.     Right Ear:  Tympanic membrane, ear canal and external ear normal.     Left Ear: Tympanic membrane, ear canal and external ear normal.     Nose: Nose normal.     Mouth/Throat:     Mouth: Mucous membranes are moist.     Pharynx: No oropharyngeal exudate.  Eyes:     General: No scleral icterus.       Right eye: No discharge.        Left eye: No discharge.     Extraocular Movements: Extraocular movements intact.     Conjunctiva/sclera: Conjunctivae normal.     Pupils: Pupils are equal, round, and reactive to light.  Neck:     Musculoskeletal: Normal range of motion and neck supple.     Thyroid: No thyromegaly.     Vascular: No carotid bruit or JVD.     Trachea: No tracheal deviation.  Cardiovascular:     Rate and Rhythm: Normal rate and regular rhythm.     Pulses: Normal pulses.     Heart sounds: Normal heart sounds. No murmur. No friction rub. No gallop.   Pulmonary:     Effort: Pulmonary effort is normal. No respiratory distress.     Breath sounds: Normal breath sounds. No wheezing or rales.  Chest:     Chest wall: No tenderness.  Abdominal:     General: Abdomen is flat. Bowel sounds are normal. There is no distension.     Palpations: Abdomen is soft. There is no mass.     Tenderness: There is no abdominal tenderness. There is no guarding or rebound.  Musculoskeletal: Normal range of motion.        General: No tenderness.     Right lower leg: No edema.     Left lower leg: No edema.  Lymphadenopathy:     Cervical: No cervical adenopathy.  Skin:     General: Skin is warm and dry.     Capillary Refill: Capillary refill takes less than 2 seconds.     Findings: No rash.  Neurological:     General: No focal deficit present.     Mental Status: She is alert and oriented to person, place, and time. Mental status is at baseline.     Cranial Nerves: No cranial nerve deficit.     Motor: No weakness.     Coordination: Coordination normal.     Gait: Gait normal.  Psychiatric:        Mood and Affect: Mood normal.        Behavior: Behavior normal.        Thought Content: Thought content normal.        Judgment: Judgment normal.    Diabetic Foot Exam - Simple   Simple Foot Form Diabetic Foot exam was performed with the following findings: Yes 10/25/2018  3:01 PM  Visual Inspection No deformities, no ulcerations, no other skin breakdown bilaterally: Yes Sensation Testing Intact to touch and monofilament testing bilaterally: Yes Pulse Check Posterior Tibialis and Dorsalis pulse intact bilaterally: Yes Comments     Activities of Daily Living In your present state of health, do you have any difficulty performing the following activities: 10/25/2018  Hearing? N  Vision? N  Difficulty concentrating or making decisions? N  Walking or climbing stairs? N  Dressing or bathing? N  Doing errands, shopping? N  Some recent data might be hidden    Fall Risk Assessment Fall Risk  10/25/2018 08/23/2017 03/26/2017 07/13/2016 04/23/2015  Falls in the past year? 0 No No  No No  Number falls in past yr: 0 - - - -  Injury with Fall? 0 - - - -     Depression Screen PHQ 2/9 Scores 10/25/2018 08/23/2017 03/26/2017 07/13/2016  PHQ - 2 Score 0 0 0 0  PHQ- 9 Score 1 - - 2    No flowsheet data found.    Assessment & Plan:     Annual Wellness Visit  Reviewed patient's Family Medical History Reviewed and updated list of patient's medical providers Assessment of cognitive impairment was done Assessed patient's functional ability Established a written  schedule for health screening [place] Completed and Reviewed  Exercise Activities and Dietary recommendations Goals   None     Immunization History  Administered Date(s) Administered  . Hepatitis B 10/26/2008, 11/25/2008  . Influenza Split 11/19/2007, 01/26/2009, 11/30/2010, 11/13/2014  . Influenza, High Dose Seasonal PF 12/21/2016  . Influenza-Unspecified 11/14/2014  . Pneumococcal Conjugate-13 04/23/2015  . Pneumococcal Polysaccharide-23 11/19/2007, 03/26/2017  . Tdap 11/30/2010    Health Maintenance  Topic Date Due  . DEXA SCAN  02/05/2015  . HEMOGLOBIN A1C  10/05/2016  . OPHTHALMOLOGY EXAM  01/26/2018  . FOOT EXAM  03/26/2018  . MAMMOGRAM  08/12/2018  . INFLUENZA VACCINE  09/14/2018  . TETANUS/TDAP  11/29/2020  . COLONOSCOPY  07/31/2021  . Hepatitis C Screening  Completed  . PNA vac Low Risk Adult  Completed     Discussed health benefits of physical activity, and encouraged her to engage in regular exercise appropriate for her age and condition.    1. Medicare annual wellness visit, subsequent Normal exam. Up to date on screenings.   2. Benign essential HTN Stable. Continue Enalapril 16m daily. Will check labs as below and f/u pending results. - CBC w/Diff/Platelet - Comprehensive Metabolic Panel (CMET) - Lipid Profile  3. Type 2 diabetes mellitus with stage 2 chronic kidney disease, with long-term current use of insulin (HCC) Last A1c in June was 8.2. Followed by Endocrinology, Dr. SGabriel Carina Has f/u next month. [facility] Records will be requested.  - CBC w/Diff/Platelet - Comprehensive Metabolic Panel (CMET) - Lipid Profile  4. Stage 2 chronic kidney disease Stable. Will check labs as below and f/u pending results. - CBC w/Diff/Platelet - Comprehensive Metabolic Panel (CMET) - Lipid Profile  5. Avitaminosis D H/O this and postmenopausal. Will check labs as below and f/u pending results. - CBC  w/Diff/Platelet - Vitamin D (25 hydroxy)  6. Pure hypercholesterolemia Stable. Continue Atorvastatin 174m Will check labs as below and f/u pending results. - CBC w/Diff/Platelet - Comprehensive Metabolic Panel (CMET) - Lipid Profile  7. Need for influenza vaccination Flu vaccine given today without complication. Patient sat upright for 15 minutes to check for adverse reaction before being released. - Flu Vaccine QUAD High Dose(Fluad)  8. Breast cancer screening Breast exam today was normal. There is no family history of breast cancer. She does perform regular self breast exams. Mammogram was ordered as below. [facility] so she may schedule her mammogram at her convenience. - MM 3D SCREEN BREAST BILATERAL; Future  9. Osteoporosis screening Never had done. Ordered as below. - DG Bone Density; Future  --------------------------------------------------------------------    JeMar DaringPA-C  [facility]

## 2018-10-25 ENCOUNTER — Ambulatory Visit (INDEPENDENT_AMBULATORY_CARE_PROVIDER_SITE_OTHER): Payer: Medicare Other | Admitting: Physician Assistant

## 2018-10-25 ENCOUNTER — Encounter: Payer: Self-pay | Admitting: Physician Assistant

## 2018-10-25 ENCOUNTER — Other Ambulatory Visit: Payer: Self-pay

## 2018-10-25 VITALS — BP 123/79 | HR 76 | Temp 97.1°F | Resp 16 | Ht 66.0 in | Wt 204.0 lb

## 2018-10-25 DIAGNOSIS — N182 Chronic kidney disease, stage 2 (mild): Secondary | ICD-10-CM | POA: Diagnosis not present

## 2018-10-25 DIAGNOSIS — I10 Essential (primary) hypertension: Secondary | ICD-10-CM

## 2018-10-25 DIAGNOSIS — E1122 Type 2 diabetes mellitus with diabetic chronic kidney disease: Secondary | ICD-10-CM | POA: Diagnosis not present

## 2018-10-25 DIAGNOSIS — E7800 Pure hypercholesterolemia, unspecified: Secondary | ICD-10-CM

## 2018-10-25 DIAGNOSIS — Z23 Encounter for immunization: Secondary | ICD-10-CM

## 2018-10-25 DIAGNOSIS — Z13820 Encounter for screening for osteoporosis: Secondary | ICD-10-CM

## 2018-10-25 DIAGNOSIS — Z794 Long term (current) use of insulin: Secondary | ICD-10-CM | POA: Diagnosis not present

## 2018-10-25 DIAGNOSIS — E559 Vitamin D deficiency, unspecified: Secondary | ICD-10-CM | POA: Diagnosis not present

## 2018-10-25 DIAGNOSIS — Z1239 Encounter for other screening for malignant neoplasm of breast: Secondary | ICD-10-CM | POA: Diagnosis not present

## 2018-10-25 DIAGNOSIS — Z0000 Encounter for general adult medical examination without abnormal findings: Secondary | ICD-10-CM | POA: Diagnosis not present

## 2018-10-25 NOTE — Patient Instructions (Signed)
Health Maintenance After Age 69 After age 69, you are at a higher risk for certain long-term diseases and infections as well as injuries from falls. Falls are a major cause of broken bones and head injuries in people who are older than age 69. Getting regular preventive care can help to keep you healthy and well. Preventive care includes getting regular testing and making lifestyle changes as recommended by your health care provider. Talk with your health care provider about:  Which screenings and tests you should have. A screening is a test that checks for a disease when you have no symptoms.  A diet and exercise plan that is right for you. What should I know about screenings and tests to prevent falls? Screening and testing are the best ways to find a health problem early. Early diagnosis and treatment give you the best chance of managing medical conditions that are common after age 69. Certain conditions and lifestyle choices may make you more likely to have a fall. Your health care provider may recommend:  Regular vision checks. Poor vision and conditions such as cataracts can make you more likely to have a fall. If you wear glasses, make sure to get your prescription updated if your vision changes.  Medicine review. Work with your health care provider to regularly review all of the medicines you are taking, including over-the-counter medicines. Ask your health care provider about any side effects that may make you more likely to have a fall. Tell your health care provider if any medicines that you take make you feel dizzy or sleepy.  Osteoporosis screening. Osteoporosis is a condition that causes the bones to get weaker. This can make the bones weak and cause them to break more easily.  Blood pressure screening. Blood pressure changes and medicines to control blood pressure can make you feel dizzy.  Strength and balance checks. Your health care provider may recommend certain tests to check your  strength and balance while standing, walking, or changing positions.  Foot health exam. Foot pain and numbness, as well as not wearing proper footwear, can make you more likely to have a fall.  Depression screening. You may be more likely to have a fall if you have a fear of falling, feel emotionally low, or feel unable to do activities that you used to do.  Alcohol use screening. Using too much alcohol can affect your balance and may make you more likely to have a fall. What actions can I take to lower my risk of falls? General instructions  Talk with your health care provider about your risks for falling. Tell your health care provider if: ? You fall. Be sure to tell your health care provider about all falls, even ones that seem minor. ? You feel dizzy, sleepy, or off-balance.  Take over-the-counter and prescription medicines only as told by your health care provider. These include any supplements.  Eat a healthy diet and maintain a healthy weight. A healthy diet includes low-fat dairy products, low-fat (lean) meats, and fiber from whole grains, beans, and lots of fruits and vegetables. Home safety  Remove any tripping hazards, such as rugs, cords, and clutter.  Install safety equipment such as grab bars in bathrooms and safety rails on stairs.  Keep rooms and walkways well-lit. Activity   Follow a regular exercise program to stay fit. This will help you maintain your balance. Ask your health care provider what types of exercise are appropriate for you.  If you need a cane or   walker, use it as recommended by your health care provider.  Wear supportive shoes that have nonskid soles. Lifestyle  Do not drink alcohol if your health care provider tells you not to drink.  If you drink alcohol, limit how much you have: ? 0-1 drink a day for women. ? 0-2 drinks a day for men.  Be aware of how much alcohol is in your drink. In the [place] one drink equals one typical bottle of beer (12  oz), one-half glass of wine (5 oz), or one shot of hard liquor (1 oz).  Do not use any products that contain nicotine or tobacco, such as cigarettes and e-cigarettes. If you need help quitting, ask your health care provider. Summary  Having a healthy lifestyle and getting preventive care can help to protect your health and wellness after age 69.  Screening and testing are the best way to find a health problem early and help you avoid having a fall. Early diagnosis and treatment give you the best chance for managing medical conditions that are more common for people who are older than age 69.  Falls are a major cause of broken bones and head injuries in people who are older than age 69. Take precautions to prevent a fall at home.  Work with your health care provider to learn what changes you can make to improve your health and wellness and to prevent falls. This information is not intended to replace advice given to you by your health care provider. Make sure you discuss any questions you have with your health care provider. Document Released: 12/13/2016 Document Revised: 05/23/2018 Document Reviewed: 12/13/2016 Elsevier Patient Education  2020 [employer]  

## 2018-10-26 LAB — VITAMIN D 25 HYDROXY (VIT D DEFICIENCY, FRACTURES): Vit D, 25-Hydroxy: 14.5 ng/mL — ABNORMAL LOW (ref 30.0–100.0)

## 2018-10-26 LAB — COMPREHENSIVE METABOLIC PANEL
ALT: 15 IU/L (ref 0–32)
AST: 18 IU/L (ref 0–40)
Albumin/Globulin Ratio: 1.4 (ref 1.2–2.2)
Albumin: 4.4 g/dL (ref 3.8–4.8)
Alkaline Phosphatase: 65 IU/L (ref 39–117)
BUN/Creatinine Ratio: 21 (ref 12–28)
BUN: 22 mg/dL (ref 8–27)
Bilirubin Total: 0.2 mg/dL (ref 0.0–1.2)
CO2: 21 mmol/L (ref 20–29)
Calcium: 9.5 mg/dL (ref 8.7–10.3)
Chloride: 104 mmol/L (ref 96–106)
Creatinine, Ser: 1.05 mg/dL — ABNORMAL HIGH (ref 0.57–1.00)
GFR calc Af Amer: 63 mL/min/{1.73_m2} (ref >59)
GFR calc non Af Amer: 55 mL/min/{1.73_m2} — ABNORMAL LOW (ref >59)
Globulin, Total: 3.1 g/dL (ref 1.5–4.5)
Glucose: 91 mg/dL (ref 65–99)
Potassium: 4.2 mmol/L (ref 3.5–5.2)
Sodium: 140 mmol/L (ref 134–144)
Total Protein: 7.5 g/dL (ref 6.0–8.5)

## 2018-10-26 LAB — CBC WITH DIFFERENTIAL/PLATELET
Basophils Absolute: 0 10*3/uL (ref 0.0–0.2)
Basos: 0 %
EOS (ABSOLUTE): 0.2 10*3/uL (ref 0.0–0.4)
Eos: 2 %
Hematocrit: 38.6 % (ref 34.0–46.6)
Hemoglobin: 12.8 g/dL (ref 11.1–15.9)
Immature Grans (Abs): 0 10*3/uL (ref 0.0–0.1)
Immature Granulocytes: 0 %
Lymphocytes Absolute: 3.6 10*3/uL — ABNORMAL HIGH (ref 0.7–3.1)
Lymphs: 38 %
MCH: 28.3 pg (ref 26.6–33.0)
MCHC: 33.2 g/dL (ref 31.5–35.7)
MCV: 85 fL (ref 79–97)
Monocytes Absolute: 0.5 10*3/uL (ref 0.1–0.9)
Monocytes: 6 %
Neutrophils Absolute: 5.1 10*3/uL (ref 1.4–7.0)
Neutrophils: 54 %
Platelets: 161 10*3/uL (ref 150–450)
RBC: 4.53 x10E6/uL (ref 3.77–5.28)
RDW: 13.3 % (ref 11.7–15.4)
WBC: 9.4 10*3/uL (ref 3.4–10.8)

## 2018-10-26 LAB — LIPID PANEL
Chol/HDL Ratio: 3.3 ratio (ref 0.0–4.4)
Cholesterol, Total: 130 mg/dL (ref 100–199)
HDL: 39 mg/dL — ABNORMAL LOW (ref >39)
LDL Chol Calc (NIH): 65 mg/dL (ref 0–99)
Triglycerides: 148 mg/dL (ref 0–149)
VLDL Cholesterol Cal: 26 mg/dL (ref 5–40)

## 2018-10-26 MED ORDER — VITAMIN D (ERGOCALCIFEROL) 1.25 MG (50000 UNIT) PO CAPS: 50000.0000 [IU] | ORAL_CAPSULE | ORAL | 0 refills | Status: DC

## 2018-10-26 NOTE — Addendum Note (Signed)
Addended by: Mar Daring on: 10/26/2018 02:48 PM   Modules accepted: Orders

## 2018-10-28 ENCOUNTER — Other Ambulatory Visit: Payer: Self-pay | Admitting: *Deleted

## 2018-10-29 DIAGNOSIS — Z20828 Contact with and (suspected) exposure to other viral communicable diseases: Secondary | ICD-10-CM | POA: Diagnosis not present

## 2018-11-05 ENCOUNTER — Telehealth: Payer: Self-pay | Admitting: Physician Assistant

## 2018-11-05 ENCOUNTER — Other Ambulatory Visit: Payer: Self-pay | Admitting: Physician Assistant

## 2018-11-05 DIAGNOSIS — R928 Other abnormal and inconclusive findings on diagnostic imaging of breast: Secondary | ICD-10-CM

## 2018-11-05 DIAGNOSIS — N6320 Unspecified lump in the left breast, unspecified quadrant: Secondary | ICD-10-CM

## 2018-11-05 DIAGNOSIS — Z1231 Encounter for screening mammogram for malignant neoplasm of breast: Secondary | ICD-10-CM

## 2018-11-05 NOTE — Telephone Encounter (Signed)
[facility] needing [name] to sign off on mammogram and bone density.  Pt didn't come to f/u in 2019. Pt was confused on coming back in 2019.  Needing the sign off to make appt.  Please call pt back to let her know this has been done.  Thanks, [employer]

## 2018-11-05 NOTE — Telephone Encounter (Signed)
Done and BMD was ordered on 10/25/18

## 2018-11-22 DIAGNOSIS — I10 Essential (primary) hypertension: Secondary | ICD-10-CM | POA: Diagnosis not present

## 2018-11-22 DIAGNOSIS — E1165 Type 2 diabetes mellitus with hyperglycemia: Secondary | ICD-10-CM | POA: Diagnosis not present

## 2018-11-23 ENCOUNTER — Emergency Department
Admission: EM | Admit: 2018-11-23 | Discharge: 2018-11-23 | Disposition: A | Discharge status: Home / Self Care | Payer: No Typology Code available for payment source | Attending: Emergency Medicine | Admitting: Emergency Medicine

## 2018-11-23 ENCOUNTER — Other Ambulatory Visit: Payer: Self-pay

## 2018-11-23 ENCOUNTER — Emergency Department: Payer: No Typology Code available for payment source

## 2018-11-23 DIAGNOSIS — I10 Essential (primary) hypertension: Secondary | ICD-10-CM | POA: Diagnosis not present

## 2018-11-23 DIAGNOSIS — Y999 Unspecified external cause status: Secondary | ICD-10-CM | POA: Diagnosis not present

## 2018-11-23 DIAGNOSIS — Y92410 Unspecified street and highway as the place of occurrence of the external cause: Secondary | ICD-10-CM | POA: Insufficient documentation

## 2018-11-23 DIAGNOSIS — S8011XA Contusion of right lower leg, initial encounter: Secondary | ICD-10-CM | POA: Diagnosis not present

## 2018-11-23 DIAGNOSIS — Z79899 Other long term (current) drug therapy: Secondary | ICD-10-CM | POA: Diagnosis not present

## 2018-11-23 DIAGNOSIS — S20211A Contusion of right front wall of thorax, initial encounter: Secondary | ICD-10-CM

## 2018-11-23 DIAGNOSIS — R079 Chest pain, unspecified: Secondary | ICD-10-CM | POA: Diagnosis not present

## 2018-11-23 DIAGNOSIS — Z87891 Personal history of nicotine dependence: Secondary | ICD-10-CM | POA: Insufficient documentation

## 2018-11-23 DIAGNOSIS — Z794 Long term (current) use of insulin: Secondary | ICD-10-CM | POA: Diagnosis not present

## 2018-11-23 DIAGNOSIS — Y93I9 Activity, other involving external motion: Secondary | ICD-10-CM | POA: Diagnosis not present

## 2018-11-23 DIAGNOSIS — S299XXA Unspecified injury of thorax, initial encounter: Secondary | ICD-10-CM | POA: Diagnosis not present

## 2018-11-23 DIAGNOSIS — E119 Type 2 diabetes mellitus without complications: Secondary | ICD-10-CM | POA: Diagnosis not present

## 2018-11-23 DIAGNOSIS — M79661 Pain in right lower leg: Secondary | ICD-10-CM | POA: Diagnosis not present

## 2018-11-23 DIAGNOSIS — S8991XA Unspecified injury of right lower leg, initial encounter: Secondary | ICD-10-CM | POA: Diagnosis not present

## 2018-11-23 LAB — CBC WITH DIFFERENTIAL/PLATELET
Abs Immature Granulocytes: 0.04 10*3/uL (ref 0.00–0.07)
Basophils Absolute: 0 10*3/uL (ref 0.0–0.1)
Basophils Relative: 0 %
Eosinophils Absolute: 0.1 10*3/uL (ref 0.0–0.5)
Eosinophils Relative: 1 %
HCT: 38 % (ref 36.0–46.0)
Hemoglobin: 12 g/dL (ref 12.0–15.0)
Immature Granulocytes: 0 %
Lymphocytes Relative: 27 %
Lymphs Abs: 3 10*3/uL (ref 0.7–4.0)
MCH: 28.2 pg (ref 26.0–34.0)
MCHC: 31.6 g/dL (ref 30.0–36.0)
MCV: 89.2 fL (ref 80.0–100.0)
Monocytes Absolute: 0.6 10*3/uL (ref 0.1–1.0)
Monocytes Relative: 6 %
Neutro Abs: 7.2 10*3/uL (ref 1.7–7.7)
Neutrophils Relative %: 66 %
Platelets: 149 10*3/uL — ABNORMAL LOW (ref 150–400)
RBC: 4.26 MIL/uL (ref 3.87–5.11)
RDW: 13.3 % (ref 11.5–15.5)
WBC: 11.1 10*3/uL — ABNORMAL HIGH (ref 4.0–10.5)
nRBC: 0 % (ref 0.0–0.2)

## 2018-11-23 LAB — BASIC METABOLIC PANEL
Anion gap: 8 (ref 5–15)
BUN: 22 mg/dL (ref 8–23)
CO2: 24 mmol/L (ref 22–32)
Calcium: 9.2 mg/dL (ref 8.9–10.3)
Chloride: 106 mmol/L (ref 98–111)
Creatinine, Ser: 0.87 mg/dL (ref 0.44–1.00)
GFR calc Af Amer: >60 mL/min (ref >60)
GFR calc non Af Amer: >60 mL/min (ref >60)
Glucose, Bld: 128 mg/dL — ABNORMAL HIGH (ref 70–99)
Potassium: 4.4 mmol/L (ref 3.5–5.1)
Sodium: 138 mmol/L (ref 135–145)

## 2018-11-23 LAB — TROPONIN I (HIGH SENSITIVITY)
Troponin I (High Sensitivity): 30 ng/L — ABNORMAL HIGH (ref <18)
Troponin I (High Sensitivity): 37 ng/L — ABNORMAL HIGH (ref <18)

## 2018-11-23 MED ORDER — METHOCARBAMOL 500 MG PO TABS: 500.0000 mg | ORAL_TABLET | Freq: Three times a day (TID) | ORAL | 0 refills | Status: DC | PRN

## 2018-11-23 MED ORDER — BACITRACIN-NEOMYCIN-POLYMYXIN 400-5-5000 EX OINT
TOPICAL_OINTMENT | Freq: Once | CUTANEOUS | Status: AC
  Administered 2018-11-23: 20:00:00 via TOPICAL
  Filled 2018-11-23: qty 1

## 2018-11-23 MED ORDER — MELOXICAM 7.5 MG PO TABS: 7.5000 mg | ORAL_TABLET | Freq: Every day | ORAL | 0 refills | Status: DC

## 2018-11-23 MED ORDER — NAPROXEN 500 MG PO TABS
500.0000 mg | ORAL_TABLET | Freq: Once | ORAL | Status: AC
  Administered 2018-11-23: 500 mg via ORAL
  Filled 2018-11-23: qty 1
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
## 2018-11-23 NOTE — ED Notes (Signed)
Neosporin applied to abrasion on shin and non-stick dressing applied. Pt educated on cleaning and wound care and verbalized understanding.
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
## 2018-11-25 ENCOUNTER — Telehealth: Payer: Self-pay

## 2018-11-25 NOTE — Telephone Encounter (Signed)
I have read her notes and if she is feeling well she can just skip for now and call if something changes.
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
## 2018-11-26 NOTE — Telephone Encounter (Signed)
Patient was advised.  

## 2018-11-29 DIAGNOSIS — E1165 Type 2 diabetes mellitus with hyperglycemia: Secondary | ICD-10-CM | POA: Diagnosis not present

## 2018-11-29 DIAGNOSIS — R809 Proteinuria, unspecified: Secondary | ICD-10-CM | POA: Diagnosis not present

## 2018-11-29 DIAGNOSIS — E1129 Type 2 diabetes mellitus with other diabetic kidney complication: Secondary | ICD-10-CM | POA: Diagnosis not present

## 2018-11-29 DIAGNOSIS — Z794 Long term (current) use of insulin: Secondary | ICD-10-CM | POA: Diagnosis not present

## 2018-12-01 ENCOUNTER — Encounter: Payer: Self-pay | Admitting: Emergency Medicine

## 2018-12-01 ENCOUNTER — Emergency Department
Admission: EM | Admit: 2018-12-01 | Discharge: 2018-12-01 | Disposition: A | Discharge status: Home / Self Care | Payer: No Typology Code available for payment source | Attending: Student | Admitting: Student

## 2018-12-01 ENCOUNTER — Other Ambulatory Visit: Payer: Self-pay

## 2018-12-01 DIAGNOSIS — L03115 Cellulitis of right lower limb: Secondary | ICD-10-CM | POA: Insufficient documentation

## 2018-12-01 DIAGNOSIS — Z87891 Personal history of nicotine dependence: Secondary | ICD-10-CM | POA: Insufficient documentation

## 2018-12-01 DIAGNOSIS — Z794 Long term (current) use of insulin: Secondary | ICD-10-CM | POA: Insufficient documentation

## 2018-12-01 DIAGNOSIS — E1122 Type 2 diabetes mellitus with diabetic chronic kidney disease: Secondary | ICD-10-CM | POA: Insufficient documentation

## 2018-12-01 DIAGNOSIS — N189 Chronic kidney disease, unspecified: Secondary | ICD-10-CM | POA: Insufficient documentation

## 2018-12-01 DIAGNOSIS — I129 Hypertensive chronic kidney disease with stage 1 through stage 4 chronic kidney disease, or unspecified chronic kidney disease: Secondary | ICD-10-CM | POA: Insufficient documentation

## 2018-12-01 DIAGNOSIS — Z79899 Other long term (current) drug therapy: Secondary | ICD-10-CM | POA: Diagnosis not present

## 2018-12-01 DIAGNOSIS — E119 Type 2 diabetes mellitus without complications: Secondary | ICD-10-CM | POA: Insufficient documentation

## 2018-12-01 DIAGNOSIS — M79661 Pain in right lower leg: Secondary | ICD-10-CM | POA: Diagnosis present

## 2018-12-01 LAB — CBC WITH DIFFERENTIAL/PLATELET
Abs Immature Granulocytes: 0.04 10*3/uL (ref 0.00–0.07)
Basophils Absolute: 0 10*3/uL (ref 0.0–0.1)
Basophils Relative: 0 %
Eosinophils Absolute: 0.2 10*3/uL (ref 0.0–0.5)
Eosinophils Relative: 1 %
HCT: 37 % (ref 36.0–46.0)
Hemoglobin: 11.7 g/dL — ABNORMAL LOW (ref 12.0–15.0)
Immature Granulocytes: 0 %
Lymphocytes Relative: 33 %
Lymphs Abs: 3.5 10*3/uL (ref 0.7–4.0)
MCH: 28.2 pg (ref 26.0–34.0)
MCHC: 31.6 g/dL (ref 30.0–36.0)
MCV: 89.2 fL (ref 80.0–100.0)
Monocytes Absolute: 0.5 10*3/uL (ref 0.1–1.0)
Monocytes Relative: 4 %
Neutro Abs: 6.3 10*3/uL (ref 1.7–7.7)
Neutrophils Relative %: 62 %
Platelets: 161 10*3/uL (ref 150–400)
RBC: 4.15 MIL/uL (ref 3.87–5.11)
RDW: 13.4 % (ref 11.5–15.5)
WBC: 10.4 10*3/uL (ref 4.0–10.5)
nRBC: 0 % (ref 0.0–0.2)

## 2018-12-01 LAB — COMPREHENSIVE METABOLIC PANEL
ALT: 16 U/L (ref 0–44)
AST: 20 U/L (ref 15–41)
Albumin: 3.9 g/dL (ref 3.5–5.0)
Alkaline Phosphatase: 47 U/L (ref 38–126)
Anion gap: 12 (ref 5–15)
BUN: 33 mg/dL — ABNORMAL HIGH (ref 8–23)
CO2: 22 mmol/L (ref 22–32)
Calcium: 9.4 mg/dL (ref 8.9–10.3)
Chloride: 106 mmol/L (ref 98–111)
Creatinine, Ser: 1.07 mg/dL — ABNORMAL HIGH (ref 0.44–1.00)
GFR calc Af Amer: >60 mL/min (ref >60)
GFR calc non Af Amer: 53 mL/min — ABNORMAL LOW (ref >60)
Glucose, Bld: 158 mg/dL — ABNORMAL HIGH (ref 70–99)
Potassium: 4 mmol/L (ref 3.5–5.1)
Sodium: 140 mmol/L (ref 135–145)
Total Bilirubin: 0.5 mg/dL (ref 0.3–1.2)
Total Protein: 7.8 g/dL (ref 6.5–8.1)

## 2018-12-01 MED ORDER — CEPHALEXIN 500 MG PO CAPS: 500.0000 mg | ORAL_CAPSULE | Freq: Four times a day (QID) | ORAL | 0 refills | Status: AC

## 2018-12-01 MED ORDER — SODIUM CHLORIDE 0.9% FLUSH: 3.0000 mL | Freq: Once | INTRAVENOUS | Status: DC

## 2018-12-01 MED ORDER — SULFAMETHOXAZOLE-TRIMETHOPRIM 800-160 MG PO TABS: 1.0000 | ORAL_TABLET | Freq: Two times a day (BID) | ORAL | 0 refills | Status: AC
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
## 2018-12-01 NOTE — Discharge Instructions (Signed)
Thank you for letting us take care of you in the emergency department today.   Please continue to take any regular, prescribed medications.   New medications we have prescribed:  - Keflex (cephalexin)  - Bactrim (TMP/SMX) - these are both antibiotics for skin infection, please take as directed. Please stay well hydrated when you are on these antibiotics.   Please follow up with: - Your primary care doctor to review your ER visit and follow up on your symptoms.   Please return to the ER for any new or worsening symptoms.
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
## 2018-12-09 ENCOUNTER — Other Ambulatory Visit: Payer: Self-pay

## 2018-12-09 DIAGNOSIS — Z20822 Contact with and (suspected) exposure to covid-19: Secondary | ICD-10-CM

## 2018-12-10 LAB — NOVEL CORONAVIRUS, NAA: SARS-CoV-2, NAA: NOT DETECTED

## 2018-12-18 ENCOUNTER — Ambulatory Visit
Admission: RE | Admit: 2018-12-18 | Discharge: 2018-12-18 | Disposition: A | Discharge status: Home / Self Care | Payer: Medicare Other | Source: Ambulatory Visit | Attending: Physician Assistant | Admitting: Physician Assistant

## 2018-12-18 ENCOUNTER — Other Ambulatory Visit: Payer: Federal, State, Local not specified - PPO

## 2018-12-18 DIAGNOSIS — Z1231 Encounter for screening mammogram for malignant neoplasm of breast: Secondary | ICD-10-CM | POA: Insufficient documentation

## 2018-12-18 DIAGNOSIS — R928 Other abnormal and inconclusive findings on diagnostic imaging of breast: Secondary | ICD-10-CM | POA: Diagnosis not present

## 2018-12-18 DIAGNOSIS — N6320 Unspecified lump in the left breast, unspecified quadrant: Secondary | ICD-10-CM | POA: Diagnosis not present

## 2018-12-18 DIAGNOSIS — N6311 Unspecified lump in the right breast, upper outer quadrant: Secondary | ICD-10-CM | POA: Diagnosis not present

## 2018-12-18 DIAGNOSIS — N6489 Other specified disorders of breast: Secondary | ICD-10-CM | POA: Diagnosis not present

## 2019-01-04 ENCOUNTER — Other Ambulatory Visit: Payer: Self-pay | Admitting: Physician Assistant

## 2019-01-04 DIAGNOSIS — E7800 Pure hypercholesterolemia, unspecified: Secondary | ICD-10-CM

## 2019-01-07 ENCOUNTER — Other Ambulatory Visit: Payer: Federal, State, Local not specified - PPO

## 2019-01-08 ENCOUNTER — Telehealth: Payer: Self-pay | Admitting: Physician Assistant

## 2019-01-08 DIAGNOSIS — E7800 Pure hypercholesterolemia, unspecified: Secondary | ICD-10-CM

## 2019-01-08 MED ORDER — ATORVASTATIN CALCIUM 10 MG PO TABS: ORAL_TABLET | ORAL | 1 refills | Status: DC

## 2019-01-08 NOTE — Telephone Encounter (Signed)
Sent in

## 2019-01-08 NOTE — Telephone Encounter (Signed)
Please Review

## 2019-01-08 NOTE — Telephone Encounter (Signed)
Pt stated she picked up rx for atorvastatin (LIPITOR) 10 MG tablet and the bottle stated No Refills, CPE needed. Pt stated she saw Anderson Malta on 10/25/18 and had labs drawn and assumed that was her CPE. All labs were normal. She would like a callback regarding this so she can have multiple refills. Please advise.

## 2019-02-11 DIAGNOSIS — Z20828 Contact with and (suspected) exposure to other viral communicable diseases: Secondary | ICD-10-CM | POA: Diagnosis not present

## 2019-02-11 DIAGNOSIS — U071 COVID-19: Secondary | ICD-10-CM | POA: Diagnosis not present

## 2019-02-17 DIAGNOSIS — U071 COVID-19: Secondary | ICD-10-CM | POA: Insufficient documentation

## 2019-02-17 DIAGNOSIS — E1165 Type 2 diabetes mellitus with hyperglycemia: Secondary | ICD-10-CM | POA: Diagnosis not present

## 2019-02-17 DIAGNOSIS — I10 Essential (primary) hypertension: Secondary | ICD-10-CM | POA: Insufficient documentation

## 2019-02-20 ENCOUNTER — Other Ambulatory Visit: Payer: Federal, State, Local not specified - PPO

## 2019-03-27 DIAGNOSIS — Z23 Encounter for immunization: Secondary | ICD-10-CM | POA: Diagnosis not present

## 2019-03-28 DIAGNOSIS — E1129 Type 2 diabetes mellitus with other diabetic kidney complication: Secondary | ICD-10-CM | POA: Diagnosis not present

## 2019-03-28 DIAGNOSIS — R809 Proteinuria, unspecified: Secondary | ICD-10-CM | POA: Diagnosis not present

## 2019-03-28 DIAGNOSIS — Z794 Long term (current) use of insulin: Secondary | ICD-10-CM | POA: Diagnosis not present

## 2019-04-04 DIAGNOSIS — I10 Essential (primary) hypertension: Secondary | ICD-10-CM | POA: Diagnosis not present

## 2019-04-04 DIAGNOSIS — E1165 Type 2 diabetes mellitus with hyperglycemia: Secondary | ICD-10-CM | POA: Diagnosis not present

## 2019-04-04 DIAGNOSIS — E1169 Type 2 diabetes mellitus with other specified complication: Secondary | ICD-10-CM | POA: Diagnosis not present

## 2019-04-04 DIAGNOSIS — E1129 Type 2 diabetes mellitus with other diabetic kidney complication: Secondary | ICD-10-CM | POA: Diagnosis not present

## 2019-04-04 DIAGNOSIS — R809 Proteinuria, unspecified: Secondary | ICD-10-CM | POA: Diagnosis not present

## 2019-04-04 DIAGNOSIS — Z794 Long term (current) use of insulin: Secondary | ICD-10-CM | POA: Diagnosis not present

## 2019-04-04 DIAGNOSIS — E785 Hyperlipidemia, unspecified: Secondary | ICD-10-CM | POA: Diagnosis not present

## 2019-04-30 DIAGNOSIS — Z23 Encounter for immunization: Secondary | ICD-10-CM | POA: Diagnosis not present

## 2019-05-22 LAB — HM DIABETES EYE EXAM

## 2019-06-27 DIAGNOSIS — E1129 Type 2 diabetes mellitus with other diabetic kidney complication: Secondary | ICD-10-CM | POA: Diagnosis not present

## 2019-06-27 DIAGNOSIS — R809 Proteinuria, unspecified: Secondary | ICD-10-CM | POA: Diagnosis not present

## 2019-06-27 DIAGNOSIS — Z794 Long term (current) use of insulin: Secondary | ICD-10-CM | POA: Diagnosis not present

## 2019-07-04 DIAGNOSIS — Z794 Long term (current) use of insulin: Secondary | ICD-10-CM | POA: Diagnosis not present

## 2019-07-04 DIAGNOSIS — E785 Hyperlipidemia, unspecified: Secondary | ICD-10-CM | POA: Diagnosis not present

## 2019-07-04 DIAGNOSIS — I10 Essential (primary) hypertension: Secondary | ICD-10-CM | POA: Diagnosis not present

## 2019-07-04 DIAGNOSIS — E1129 Type 2 diabetes mellitus with other diabetic kidney complication: Secondary | ICD-10-CM | POA: Diagnosis not present

## 2019-07-04 DIAGNOSIS — R809 Proteinuria, unspecified: Secondary | ICD-10-CM | POA: Diagnosis not present

## 2019-07-04 DIAGNOSIS — E1169 Type 2 diabetes mellitus with other specified complication: Secondary | ICD-10-CM | POA: Diagnosis not present

## 2019-07-10 LAB — HEMOGLOBIN A1C: Hemoglobin A1C: 7.5

## 2019-08-03 ENCOUNTER — Other Ambulatory Visit: Payer: Self-pay | Admitting: Physician Assistant

## 2019-08-03 DIAGNOSIS — E7800 Pure hypercholesterolemia, unspecified: Secondary | ICD-10-CM

## 2019-08-03 NOTE — Telephone Encounter (Signed)
Requested medication (s) are due for refill today: yes  Requested medication (s) are on the active medication list: yes  Last refill:  01/08/19  Future visit scheduled: no  Notes to clinic:  Called pt and LM on VM to call office to set up appt   Requested Prescriptions  Pending Prescriptions Disp Refills   atorvastatin (LIPITOR) 10 MG tablet [Pharmacy Med Name: Atorvastatin Calcium 10 MG Oral Tablet] 90 tablet 0    Sig: TAKE 1 TABLET BY MOUTH ONCE DAILY.      Cardiovascular:  Antilipid - Statins Failed - 08/03/2019  3:48 PM      Failed - LDL in normal range and within 360 days    LDL Chol Calc (NIH)  Date Value Ref Range Status  10/25/2018 65 0 - 99 mg/dL Final          Failed - HDL in normal range and within 360 days    HDL  Date Value Ref Range Status  10/25/2018 39 (L) >39 mg/dL Final          Failed - Valid encounter within last 12 months    Recent Outpatient Visits           2 years ago Annual physical exam   [facility] [place], Clearnce Sorrel, [place]   2 years ago Need for influenza vaccination   [place], [place], [place]   2 years ago Acute cystitis without hematuria   [place], Clearnce Sorrel, [place]   3 years ago [facility] discharge follow-up   [place], Anderson Malta M, [place]   3 years ago Rotator cuff tendinitis, right   [facility] [place], Anderson Malta M, [place]              Passed - Total Cholesterol in normal range and within 360 days    Cholesterol, Total  Date Value Ref Range Status  10/25/2018 130 100 - 199 mg/dL Final          Passed - Triglycerides in normal range and within 360 days    Triglycerides  Date Value Ref Range Status  10/25/2018 148 0 - 149 mg/dL Final          Passed - Patient is not pregnant

## 2019-08-04 NOTE — Telephone Encounter (Signed)
11 day fill to upcoming appt Requested Prescriptions  Pending Prescriptions Disp Refills  . atorvastatin (LIPITOR) 10 MG tablet [Pharmacy Med Name: Atorvastatin Calcium 10 MG Oral Tablet] 11 tablet 0    Sig: Take 1 tablet by mouth once daily     Cardiovascular:  Antilipid - Statins Failed - 08/04/2019 10:37 AM      Failed - LDL in normal range and within 360 days    LDL Chol Calc (NIH)  Date Value Ref Range Status  10/25/2018 65 0 - 99 mg/dL Final         Failed - HDL in normal range and within 360 days    HDL  Date Value Ref Range Status  10/25/2018 39 (L) >39 mg/dL Final         Failed - Valid encounter within last 12 months    Recent Outpatient Visits          2 years ago Annual physical exam   [facility] [place], Clearnce Sorrel, [place]   2 years ago Need for influenza vaccination   [place], [place], [place]   2 years ago Acute cystitis without hematuria   [place], Clearnce Sorrel, [place]   3 years ago [facility] discharge follow-up   [place], Clearnce Sorrel, [place]   3 years ago Rotator cuff tendinitis, right   [place], [place], [place]      Future Appointments            In 1 week Marlyn Corporal, Clearnce Sorrel, PA-C [employer], [facility]           Passed - Total Cholesterol in normal range and within 360 days    Cholesterol, Total  Date Value Ref Range Status  10/25/2018 130 100 - 199 mg/dL Final         Passed - Triglycerides in normal range and within 360 days    Triglycerides  Date Value Ref Range Status  10/25/2018 148 0 - 149 mg/dL Final         Passed - Patient is not pregnant

## 2019-08-04 NOTE — Telephone Encounter (Signed)
Left VM for patient to call and schedule OV.

## 2019-08-04 NOTE — Telephone Encounter (Signed)
L.O.V. was 10/25/2018 and called patient to schedule appointment for follow-up visit. No answer if patient calls back okay for [facility] to schedule a office visit for patient.

## 2019-08-04 NOTE — Telephone Encounter (Signed)
Patient called back and scheduled medication refill appt for Friday August 15, 2019- Patient is requesting a short term dosage of atorvastin until her appt on 08/15/2019. Preferred Pharmacy is [place]

## 2019-08-15 ENCOUNTER — Ambulatory Visit (INDEPENDENT_AMBULATORY_CARE_PROVIDER_SITE_OTHER): Payer: Medicare Other | Admitting: Physician Assistant

## 2019-08-15 ENCOUNTER — Encounter: Payer: Self-pay | Admitting: Physician Assistant

## 2019-08-15 ENCOUNTER — Other Ambulatory Visit: Payer: Self-pay

## 2019-08-15 VITALS — BP 124/89 | HR 83 | Temp 97.2°F | Resp 16 | Wt 200.4 lb

## 2019-08-15 DIAGNOSIS — E6609 Other obesity due to excess calories: Secondary | ICD-10-CM | POA: Diagnosis not present

## 2019-08-15 DIAGNOSIS — E119 Type 2 diabetes mellitus without complications: Secondary | ICD-10-CM

## 2019-08-15 DIAGNOSIS — Z780 Asymptomatic menopausal state: Secondary | ICD-10-CM | POA: Diagnosis not present

## 2019-08-15 DIAGNOSIS — E7800 Pure hypercholesterolemia, unspecified: Secondary | ICD-10-CM

## 2019-08-15 DIAGNOSIS — Z13820 Encounter for screening for osteoporosis: Secondary | ICD-10-CM

## 2019-08-15 DIAGNOSIS — Z6833 Body mass index (BMI) 33.0-33.9, adult: Secondary | ICD-10-CM

## 2019-08-15 DIAGNOSIS — M7581 Other shoulder lesions, right shoulder: Secondary | ICD-10-CM | POA: Diagnosis not present

## 2019-08-15 MED ORDER — NAPROXEN 500 MG PO TABS: ORAL_TABLET | ORAL | 1 refills | Status: DC

## 2019-08-15 MED ORDER — ATORVASTATIN CALCIUM 10 MG PO TABS: 10.0000 mg | ORAL_TABLET | Freq: Every day | ORAL | 1 refills | Status: DC

## 2019-08-15 NOTE — Progress Notes (Signed)
Established patient visit   Patient: Julie [name]   DOB: 12/26/1950   69 y.o. Female  MRN: [id number] Visit Date: 08/15/2019  Today's healthcare provider: Mar Daring, PA-C   Chief Complaint  Patient presents with  . Hyperlipidemia   Subjective    HPI  Type 2 diabetes mellitus with stage 2 chronic kidney disease. Last A1c in 06/27/19 was 7.5 (169). Followed by Endocrinology, Dr. Gabriel Carina   Lipid/Cholesterol, Follow-up  Last lipid panel Other pertinent labs  Lab Results  Component Value Date   CHOL 130 10/25/2018   HDL 39 (L) 10/25/2018   LDLCALC 65 10/25/2018   TRIG 148 10/25/2018   CHOLHDL 3.3 10/25/2018   Lab Results  Component Value Date   ALT 16 12/01/2018   AST 20 12/01/2018   PLT 161 12/01/2018   TSH 1.160 03/26/2017     She was last seen for this 10 months ago.  Management since that visit includes continue current medication. Atorvastatin 32m.  She reports excellent compliance with treatment. She is not having side effects.   Symptoms: No chest pain No chest pressure/discomfort  No dyspnea No lower extremity edema  No numbness or tingling of extremity No orthopnea  No palpitations No paroxysmal nocturnal dyspnea  No speech difficulty No syncope   Current diet: in general, a "healthy" diet   Current exercise: none  The 10-year ASCVD risk score (Mikey BussingDC JBrooke Bonito, et al., 2013) is: 16.4%  ---------------------------------------------------------------------------------------------------  Patient Active Problem List   Diagnosis Date Noted  . Chronic kidney disease 03/26/2017  . 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 Colon polyp 04/21/2015  . Benign essential HTN 04/21/2015  . Liver mass 04/21/2015  . Kidney lump 04/21/2015  . Avitaminosis D 04/21/2015  . Type 2 diabetes mellitus ([place] 04/13/2015  . 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 Urinary, incontinence, stress female 11/18/2012  . Abnormal loss of weight 12/23/2009  . Pure  hypercholesterolemia 01/26/2009  . Hepatitis C virus infection without hepatic coma 05/21/2008  . Abnormal ECG 02/09/2007  . H/O peptic ulcer 02/09/2007  . Tobacco use 02/01/2007   Past Medical History:  Diagnosis Date  . Anemia   . Arthritis   . Chronic kidney disease   . Diabetes mellitus without complication ([place]   . Diabetic acidosis ([place]   . Hepatitis    hepatitis c, treated  . History of peptic ulcer disease   . Hypertension   . Microalbuminuria        Medications: Outpatient Medications Prior to Visit  Medication Sig  . Ascorbic Acid (VITAMIN C PO) Take by mouth.  .Marland Kitchenatorvastatin (LIPITOR) 10 MG tablet Take 1 tablet by mouth once daily  . Continuous Blood Gluc Sensor (FREESTYLE LIBRE 14 DAY SENSOR) MISC Use 1 kit every 14 (fourteen) days  . Cyanocobalamin (VITAMIN B-12 PO) Take by mouth.  . enalapril (VASOTEC) 20 MG tablet Take 40 mg by mouth daily.   . hydrochlorothiazide (HYDRODIURIL) 25 MG tablet Take by mouth.  . insulin aspart (NOVOLOG FLEXPEN) 100 UNIT/ML FlexPen Take 10 units breakfast, 15 units before lunch, and 20 units before supper  . Insulin Detemir (LEVEMIR FLEXPEN) 100 UNIT/ML Pen Inject 40 Units into the skin at bedtime.   . metFORMIN (GLUCOPHAGE) 500 MG tablet Take 1,000 mg by mouth 2 (two) times daily with a meal.   . Cholecalciferol 25 MCG (1000 UT) tablet Take by mouth.  .Marland KitchenglipiZIDE (GLUCOTROL XL) 10 MG 24 hr tablet Take 10 mg by mouth daily with  breakfast.   . JANUVIA 100 MG tablet Take 100 mg by mouth daily.   . meloxicam (MOBIC) 7.5 MG tablet Take 1 tablet (7.5 mg total) by mouth daily.  . methocarbamol (ROBAXIN) 500 MG tablet Take 1 tablet (500 mg total) by mouth every 8 (eight) hours as needed for muscle spasms.  . Multiple Vitamins-Minerals (MULTIVITAMIN WITH MINERALS) tablet Take 1 tablet daily by mouth.  . naproxen (NAPROSYN) 500 MG tablet TAKE 1 TABLET BY MOUTH TWICE DAILY WITH MEALS (Patient not taking: Reported on 10/25/2018)  . ONETOUCH  VERIO test strip   . Vitamin D, Ergocalciferol, (DRISDOL) 1.25 MG (50000 UT) CAPS capsule Take 1 capsule (50,000 Units total) by mouth every 7 (seven) days.   No facility-administered medications prior to visit.    Review of Systems  Constitutional: Negative.   Respiratory: Negative.   Cardiovascular: Negative.   Musculoskeletal: Negative.   Neurological: Negative.     Last CBC Lab Results  Component Value Date   WBC 10.4 12/01/2018   HGB 11.7 (L) 12/01/2018   HCT 37.0 12/01/2018   MCV 89.2 12/01/2018   MCH 28.2 12/01/2018   RDW 13.4 12/01/2018   PLT 161 77/41/2878   Last metabolic panel Lab Results  Component Value Date   GLUCOSE 158 (H) 12/01/2018   NA 140 12/01/2018   K 4.0 12/01/2018   CL 106 12/01/2018   CO2 22 12/01/2018   BUN 33 (H) 12/01/2018   CREATININE 1.07 (H) 12/01/2018   GFRNONAA 53 (L) 12/01/2018   GFRAA >60 12/01/2018   CALCIUM 9.4 12/01/2018   PROT 7.8 12/01/2018   ALBUMIN 3.9 12/01/2018   LABGLOB 3.1 10/25/2018   AGRATIO 1.4 10/25/2018   BILITOT 0.5 12/01/2018   ALKPHOS 47 12/01/2018   AST 20 12/01/2018   ALT 16 12/01/2018   ANIONGAP 12 12/01/2018   Last lipids Lab Results  Component Value Date   CHOL 130 10/25/2018   HDL 39 (L) 10/25/2018   LDLCALC 65 10/25/2018   TRIG 148 10/25/2018   CHOLHDL 3.3 10/25/2018      Objective    BP 124/89 (BP Location: Left Arm, Patient Position: Sitting, Cuff Size: Large)   Pulse 83   Temp (!) 97.2 F (36.2 C) (Temporal)   Resp 16   Wt 200 lb 6.4 oz (90.9 kg)   BMI 33.35 kg/m  BP Readings from Last 3 Encounters:  08/15/19 124/89  12/01/18 (!) 147/74  11/23/18 (!) 147/91   Wt Readings from Last 3 Encounters:  08/15/19 200 lb 6.4 oz (90.9 kg)  12/01/18 203 lb (92.1 kg)  11/23/18 200 lb (90.7 kg)      Physical Exam Vitals reviewed.  Constitutional:      General: She is not in acute distress.    Appearance: Normal appearance. She is well-developed. She is obese. She is not ill-appearing  or diaphoretic.  HENT:     Head: Normocephalic and atraumatic.  Eyes:     General: No scleral icterus. Neck:     Thyroid: No thyromegaly.     Vascular: No JVD.     Trachea: No tracheal deviation.  Cardiovascular:     Rate and Rhythm: Normal rate and regular rhythm.     Pulses: Normal pulses.     Heart sounds: Normal heart sounds. No murmur heard.  No friction rub. No gallop.   Pulmonary:     Effort: Pulmonary effort is normal. No respiratory distress.     Breath sounds: Normal breath sounds. No wheezing or rales.  Musculoskeletal:     Cervical back: Normal range of motion and neck supple.     Right lower leg: No edema.     Left lower leg: No edema.  Lymphadenopathy:     Cervical: No cervical adenopathy.  Neurological:     Mental Status: She is alert.      Results for orders placed or performed in visit on 08/15/19  Hemoglobin A1c  Result Value Ref Range   Hemoglobin A1C 7.5     Assessment & Plan     1. Rotator cuff tendinitis, right Chronic issue. Continue naprosyn prn.  - naproxen (NAPROSYN) 500 MG tablet; TAKE 1 TABLET BY MOUTH TWICE DAILY WITH MEALS  Dispense: 90 tablet; Refill: 1  2. Hypercholesterolemia Stable. Diagnosis pulled for medication refill. Continue current medical treatment plan. - atorvastatin (LIPITOR) 10 MG tablet; Take 1 tablet (10 mg total) by mouth daily.  Dispense: 90 tablet; Refill: 1  3. Osteoporosis screening Due for bone density. Ordered as below.  - DG Bone Density; Future  4. Postmenopausal estrogen deficiency See above medical treatment plan. - DG Bone Density; Future  5. Class 1 obesity due to excess calories with serious comorbidity and body mass index (BMI) of 33.0 to 33.9 in adult Counseled patient on healthy lifestyle modifications including dieting and exercise.   6. Type 2 diabetes mellitus without complication, without long-term current use of insulin (HCC) Stable. Followed by Dr. Gabriel Carina.    No follow-ups on file.       Reynolds Bowl, PA-C, have reviewed all documentation for this visit. The documentation on 08/24/19 for the exam, diagnosis, procedures, and orders are all accurate and complete.   [place]  [facility] [phone] (phone) [phone] (fax)  [place]

## 2019-10-28 DIAGNOSIS — R809 Proteinuria, unspecified: Secondary | ICD-10-CM | POA: Diagnosis not present

## 2019-10-28 DIAGNOSIS — Z794 Long term (current) use of insulin: Secondary | ICD-10-CM | POA: Diagnosis not present

## 2019-10-28 DIAGNOSIS — E1129 Type 2 diabetes mellitus with other diabetic kidney complication: Secondary | ICD-10-CM | POA: Diagnosis not present

## 2019-10-31 ENCOUNTER — Telehealth: Payer: Self-pay | Admitting: Physician Assistant

## 2019-10-31 NOTE — Telephone Encounter (Signed)
Copied from [place] [id number]. Topic: Medicare AWV >> Oct 31, 2019  2:45 PM Cher Nakai R wrote: Reason for CRM:  Left message for patient to call back and schedule Medicare Annual Wellness Visit (AWV) either virtually or in office.  Last AWV 10/25/2018  Please schedule at anytime with [facility] Health Advisor.  If any questions, please contact me at [phone]

## 2019-11-04 DIAGNOSIS — E1169 Type 2 diabetes mellitus with other specified complication: Secondary | ICD-10-CM | POA: Diagnosis not present

## 2019-11-04 DIAGNOSIS — I10 Essential (primary) hypertension: Secondary | ICD-10-CM | POA: Diagnosis not present

## 2019-11-04 DIAGNOSIS — Z794 Long term (current) use of insulin: Secondary | ICD-10-CM | POA: Diagnosis not present

## 2019-11-04 DIAGNOSIS — E1129 Type 2 diabetes mellitus with other diabetic kidney complication: Secondary | ICD-10-CM | POA: Diagnosis not present

## 2019-11-04 DIAGNOSIS — R809 Proteinuria, unspecified: Secondary | ICD-10-CM | POA: Diagnosis not present

## 2019-11-04 DIAGNOSIS — E1165 Type 2 diabetes mellitus with hyperglycemia: Secondary | ICD-10-CM | POA: Diagnosis not present

## 2019-11-04 DIAGNOSIS — E785 Hyperlipidemia, unspecified: Secondary | ICD-10-CM | POA: Diagnosis not present

## 2019-11-13 ENCOUNTER — Other Ambulatory Visit: Payer: Self-pay

## 2019-11-13 ENCOUNTER — Ambulatory Visit
Admission: RE | Admit: 2019-11-13 | Discharge: 2019-11-13 | Disposition: A | Discharge status: Home / Self Care | Payer: Medicare Other | Source: Ambulatory Visit | Attending: Physician Assistant | Admitting: Physician Assistant

## 2019-11-13 DIAGNOSIS — Z780 Asymptomatic menopausal state: Secondary | ICD-10-CM | POA: Insufficient documentation

## 2019-11-13 DIAGNOSIS — M85852 Other specified disorders of bone density and structure, left thigh: Secondary | ICD-10-CM | POA: Diagnosis not present

## 2019-11-13 DIAGNOSIS — Z13820 Encounter for screening for osteoporosis: Secondary | ICD-10-CM | POA: Diagnosis not present

## 2019-11-13 DIAGNOSIS — Z90722 Acquired absence of ovaries, bilateral: Secondary | ICD-10-CM | POA: Diagnosis not present

## 2019-11-13 DIAGNOSIS — Z90710 Acquired absence of both cervix and uterus: Secondary | ICD-10-CM | POA: Diagnosis not present

## 2019-11-13 NOTE — Progress Notes (Signed)
This encounter was created in error - please disregard.

## 2019-11-14 ENCOUNTER — Telehealth: Payer: Self-pay

## 2019-11-14 NOTE — Telephone Encounter (Signed)
-----   Message from Mar Daring, [place] sent at 11/14/2019  1:11 PM EDT ----- Bone density shows osteopenia, early thinning of the bone. No osteoporosis. Repeat bone density in 5 years if desired.

## 2019-11-14 NOTE — Telephone Encounter (Signed)
LMTCB or to view messages in my chart

## 2019-11-17 ENCOUNTER — Other Ambulatory Visit: Payer: Self-pay

## 2019-11-18 NOTE — Telephone Encounter (Signed)
Patient advised as directed below. 

## 2019-11-18 NOTE — Progress Notes (Signed)
Subjective:   Julie [name] is a 69 y.o. female who presents for Medicare Annual (Subsequent) preventive examination.  I connected with Julie [name] today by telephone and verified that I am speaking with the correct person using two identifiers. Location patient: home Location provider: work Persons participating in the virtual visit: patient, provider.   I discussed the limitations, risks, security and privacy concerns of performing an evaluation and management service by telephone and the availability of in person appointments. I also discussed with the patient that there may be a patient responsible charge related to this service. The patient expressed understanding and verbally consented to this telephonic visit.    Interactive audio and video telecommunications were attempted between this provider and patient, however failed, due to patient having technical difficulties OR patient did not have access to video capability.  We continued and completed visit with audio only.   Review of Systems    N/A  Cardiac Risk Factors include: advanced age (>98mn, >>30women);diabetes mellitus;obesity (BMI >30kg/m2)     Objective:    There were no vitals filed for this visit. There is no height or weight on file to calculate BMI.  Advanced Directives 11/19/2019 11/23/2018 11/23/2018 07/07/2018 08/23/2017 12/29/2016 12/27/2016  Does Patient Have a Medical Advance Directive? No No No No No No No  Would patient like information on creating a medical advance directive? No - Patient declined No - Patient declined - No - Patient declined Yes (MAU/Ambulatory/Procedural Areas - Information given) No - Patient declined -    Current Medications (verified) Outpatient Encounter Medications as of 11/19/2019  Medication Sig  . Ascorbic Acid (VITAMIN C PO) Take by mouth daily.   .Marland Kitchenatorvastatin (LIPITOR) 10 MG tablet Take 1 tablet (10 mg total) by mouth daily.  . Cholecalciferol 25 MCG (1000 UT) tablet  Take 1,000 Units by mouth daily.   . Continuous Blood Gluc Sensor (FREESTYLE LIBRE 14 DAY SENSOR) MISC Use 1 kit every 14 (fourteen) days  . Cyanocobalamin (VITAMIN B-12 PO) Take by mouth daily.   . enalapril (VASOTEC) 20 MG tablet Take 40 mg by mouth daily.   . hydrochlorothiazide (HYDRODIURIL) 25 MG tablet Take by mouth.  . insulin aspart (NOVOLOG FLEXPEN) 100 UNIT/ML FlexPen Inject into the skin 3 (three) times daily with meals. Takes 18 units @ breakfast, 18 units before lunch and 26 units before dinner.  . Insulin Detemir (LEVEMIR FLEXPEN) 100 UNIT/ML Pen Inject 52 Units into the skin at bedtime.   . metFORMIN (GLUCOPHAGE) 500 MG tablet Take 1,000 mg by mouth 2 (two) times daily with a meal.   . naproxen (NAPROSYN) 500 MG tablet TAKE 1 TABLET BY MOUTH TWICE DAILY WITH MEALS   No facility-administered encounter medications on file as of 11/19/2019.    Allergies (verified) Patient has no known allergies.   History: Past Medical History:  Diagnosis Date  . Anemia   . Arthritis   . Chronic kidney disease   . Diabetes mellitus without complication ([place]   . Diabetic acidosis ([place]   . Hepatitis    hepatitis c, treated  . History of peptic ulcer disease   . Hypertension   . Microalbuminuria    Past Surgical History:  Procedure Laterality Date  . ABDOMINAL HYSTERECTOMY  1985  . COLONOSCOPY WITH PROPOFOL N/A 07/31/2016   Procedure: COLONOSCOPY WITH PROPOFOL;  Surgeon: SLollie Sails MD;  [facility];  Service: Endoscopy;  Laterality: N/A;  . DG  BONE DENSITY ([place])    .  EXCISION MASS NECK Left 12/29/2016   Procedure: CHEST WALL MASS EXCISION;  Surgeon: Robert Bellow, MD;  Location: [facility] ORS;  Service: General;  Laterality: Left;  . LAPAROSCOPIC HYSTERECTOMY     assisted vaginal hysterectomy, fibroids, ovaries removed  . LIVER BIOPSY  2009   + hepatitis C, [facility] by Gustavo Lah 04/2008.  Marland Kitchen NEPHROSTOMY W/ INTRODUCTION OF CATHETER    . TUBAL  LIGATION     Family History  Problem Relation Age of Onset  . Arthritis Mother   . Hypertension Mother   . Stroke Father   . Alcohol abuse Father   . Cirrhosis Father   . Cancer Maternal Aunt   . Diabetes Son   . Pancreatic disease Brother   . Healthy Brother   . Healthy Brother   . Breast cancer Neg Hx    Social History   Socioeconomic History  . Marital status: Married    Spouse name: Not on file  . Number of children: 4  . Years of education: Not on file  . Highest education level: Associate degree: occupational, Hotel manager, or vocational program  Occupational History  . Occupation: Driving a school bus    Comment: full time (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)  Tobacco Use  . Smoking status: Former Smoker    Packs/day: 0.50    Years: 20.00    Pack years: 10.00    Types: Cigarettes    Quit date: 02/13/2005    Years since quitting: 14.7  . Smokeless tobacco: Never Used  Vaping Use  . Vaping Use: Never used  Substance and Sexual Activity  . Alcohol use: No  . Drug use: No  . Sexual activity: Yes  Other Topics Concern  . Not on file  Social History Narrative  . Not on file   Social Determinants of Health   Financial Resource Strain: Low Risk   . Difficulty of Paying Living Expenses: Not hard at all  Food Insecurity: No Food Insecurity  . Worried About Charity fundraiser in the Last Year: Never true  . Ran Out of Food in the Last Year: Never true  Transportation Needs: No Transportation Needs  . Lack of Transportation (Medical): No  . Lack of Transportation (Non-Medical): No  Physical Activity: Insufficiently Active  . Days of Exercise per Week: 5 days  . Minutes of Exercise per Session: 10 min  Stress: No Stress Concern Present  . Feeling of Stress : Only a little  Social Connections: Socially Integrated  . Frequency of Communication with Friends and Family: More than three times a week  . Frequency of Social Gatherings with Friends and Family: More than three times  a week  . Attends Religious Services: More than 4 times per year  . Active Member of Clubs or Organizations: Yes  . Attends Archivist Meetings: More than 4 times per year  . Marital Status: Married    Tobacco Counseling Counseling given: Not Answered   Clinical Intake:  Pre-visit preparation completed: Yes  Pain : No/denies pain     Nutritional Risks: None Diabetes: Yes  How often do you need to have someone help you when you read instructions, pamphlets, or other written materials from your doctor or pharmacy?: 1 - Never  Diabetic? Yes  Nutrition Risk Assessment:  Has the patient had any N/V/D within the last 2 months?  No  Does the patient have any non-healing wounds?  No  Has the patient had any unintentional weight loss  or weight gain?  No   Diabetes:  Is the patient diabetic?  Yes  If diabetic, was a CBG obtained today?  No  Did the patient bring in their glucometer from home?  No  How often do you monitor your CBG's? 3-4 times a day.   Financial Strains and Diabetes Management:  Are you having any financial strains with the device, your supplies or your medication? No .  Does the patient want to be seen by Chronic Care Management for management of their diabetes?  No  Would the patient like to be referred to a Nutritionist or for Diabetic Management?  No   Diabetic Exams:  Diabetic Eye Exam: Completed 05/2019 per patient. Requested records to be sent to clinic.  Diabetic Foot Exam: Completed 11/04/19. Repeat yearly.   Interpreter Needed?: No  Information entered by :: [facility], LPN   Activities of Daily Living In your present state of health, do you have any difficulty performing the following activities: 11/19/2019  Hearing? N  Vision? N  Difficulty concentrating or making decisions? N  Walking or climbing stairs? N  Dressing or bathing? N  Doing errands, shopping? N  Preparing Food and eating ? N  Using the Toilet? N  In the past six  months, have you accidently leaked urine? Y  Comment Occasionally with pressure.  Do you have problems with loss of bowel control? N  Managing your Medications? N  Managing your Finances? N  Housekeeping or managing your Housekeeping? N  Some recent data might be hidden    Patient Care Team: Mar Daring, PA-C as PCP - General (Family Medicine) Bary Castilla Forest Gleason, MD as Consulting Physician (General Surgery) Ocie Doyne, [place] (Optometry) Solum, Betsey Holiday, MD as Physician Assistant (Endocrinology)  Indicate any recent Medical Services you may have received from other than [facility] providers in the past year (date may be approximate).     Assessment:   This is a routine wellness examination for [facility].  Hearing/Vision screen No exam data present  Dietary issues and exercise activities discussed: Current Exercise Habits: Home exercise routine, Type of exercise: walking, Time (Minutes): 15, Frequency (Times/Week): 5, Weekly Exercise (Minutes/Week): 75, Intensity: Mild, Exercise limited by: None identified  Goals    . Weight (lb) < 175 lb (79.4 kg)     Recommend to eat 3 small meals a day with 2 healthy snacks in between to help aid with weight loss and diabetes control.       Depression Screen PHQ 2/9 Scores 11/19/2019 10/25/2018 08/23/2017 03/26/2017 07/13/2016 04/23/2015  PHQ - 2 Score 0 0 0 0 0 0  PHQ- 9 Score - 1 - - 2 -    Fall Risk Fall Risk  11/19/2019 10/25/2018 08/23/2017 03/26/2017 07/13/2016  Falls in the past year? 0 0 No No No  Number falls in past yr: 0 0 - - -  Injury with Fall? 0 0 - - -    Any stairs in or around the home? Yes  If so, are there any without handrails? No  Home free of loose throw rugs in walkways, pet beds, electrical cords, etc? Yes  Adequate lighting in your home to reduce risk of falls? Yes   ASSISTIVE DEVICES UTILIZED TO PREVENT FALLS:  Life alert? No  Use of a cane, walker or w/c? No  Grab bars in the bathroom? No  Shower chair or bench in  shower? No  Elevated toilet seat or a handicapped toilet? No    Cognitive Function:  Declined today.         Immunizations Immunization History  Administered Date(s) Administered  . Fluad Quad(high Dose 65+) 10/25/2018  . Hepatitis B 10/26/2008, 11/25/2008  . Influenza Split 11/19/2007, 01/26/2009, 11/30/2010, 11/13/2014  . Influenza, High Dose Seasonal PF 12/21/2016  . Influenza-Unspecified 11/14/2014, 10/14/2017  . Moderna SARS-COVID-2 Vaccination 03/28/2019, 04/25/2019  . Pneumococcal Conjugate-13 04/23/2015  . Pneumococcal Polysaccharide-23 11/19/2007, 03/26/2017  . Tdap 11/30/2010    TDAP status: Up to date Flu Vaccine status: Declined, Education has been provided regarding the importance of this vaccine but patient still declined. Advised may receive this vaccine at local pharmacy or Health Dept. Aware to provide a copy of the vaccination record if obtained from local pharmacy or Health Dept. Verbalized acceptance and understanding. Pneumococcal vaccine status: Up to date Covid-19 vaccine status: Completed vaccines  Qualifies for Shingles Vaccine? Yes   Zostavax completed No   Shingrix Completed?: No.    Education has been provided regarding the importance of this vaccine. Patient has been advised to call insurance company to determine out of pocket expense if they have not yet received this vaccine. Advised may also receive vaccine at local pharmacy or Health Dept. Verbalized acceptance and understanding.  Screening Tests Health Maintenance  Topic Date Due  . OPHTHALMOLOGY EXAM  01/26/2018  . INFLUENZA VACCINE  09/14/2019  . HEMOGLOBIN A1C  01/10/2020  . FOOT EXAM  11/03/2020  . TETANUS/TDAP  11/29/2020  . MAMMOGRAM  12/17/2020  . COLONOSCOPY  07/31/2021  . DEXA SCAN  11/12/2024  . COVID-19 Vaccine  Completed  . Hepatitis C Screening  Completed  . PNA vac Low Risk Adult  Completed    Health Maintenance  Health Maintenance Due  Topic Date Due  . OPHTHALMOLOGY  EXAM  01/26/2018  . INFLUENZA VACCINE  09/14/2019    Colorectal cancer screening: Completed 07/31/16. Repeat every 5 years Mammogram status: Completed 12/18/18. Repeat every year Bone Density status: Completed 11/13/19. Results reflect: Bone density results: OSTEOPENIA. Repeat every 5 years.  Lung Cancer Screening: (Low Dose CT Chest recommended if Age 70-80 years, 30 pack-year currently smoking OR have quit w/in 15years.) does qualify.   Lung Cancer Screening Referral: An Epic message has been sent to Burgess Estelle, RN (Oncology Nurse Navigator) regarding the possible need for this exam. Raquel Sarna will review the patient's chart to determine if the patient truly qualifies for the exam. If the patient qualifies, Raquel Sarna will order the Low Dose CT of the chest to facilitate the scheduling of this exam.  Additional Screening:  Hepatitis C Screening: Up to date  Vision Screening: Recommended annual ophthalmology exams for early detection of glaucoma and other disorders of the eye. Is the patient up to date with their annual eye exam?  Yes  Who is the provider or what is the name of the office in which the patient attends annual eye exams? Dr Wyatt Portela at [facility] If pt is not established with a provider, would they like to be referred to a provider to establish care? No .   Dental Screening: Recommended annual dental exams for proper oral hygiene  Community Resource Referral / Chronic Care Management: CRR required this visit?  No   CCM required this visit?  No      Plan:     I have personally reviewed and noted the following in the patient's chart:   . Medical and social history . Use of alcohol, tobacco or illicit drugs  . Current medications and supplements . Functional ability and  status . Nutritional status . Physical activity . Advanced directives . List of other physicians . Hospitalizations, surgeries, and ER visits in previous 12 months . Vitals . Screenings to include cognitive,  depression, and falls . Referrals and appointments  In addition, I have reviewed and discussed with patient certain preventive protocols, quality metrics, and best practice recommendations. A written personalized care plan for preventive services as well as general preventive health recommendations were provided to patient.     Serene Kopf [place], [place]   45/09/996   Nurse Notes: Pt plans to receive her flu shot at the pharmacy this fall. Requested previous eye exam records to be sent to clinic.

## 2019-11-19 ENCOUNTER — Ambulatory Visit (INDEPENDENT_AMBULATORY_CARE_PROVIDER_SITE_OTHER): Payer: Medicare Other

## 2019-11-19 ENCOUNTER — Other Ambulatory Visit: Payer: Self-pay

## 2019-11-19 DIAGNOSIS — Z0000 Encounter for general adult medical examination without abnormal findings: Secondary | ICD-10-CM

## 2019-11-19 NOTE — Patient Instructions (Signed)
Julie [name] , Thank you for taking time to come for your Medicare Wellness Visit. I appreciate your ongoing commitment to your health goals. Please review the following plan we discussed and let me know if I can assist you in the future.   Screening recommendations/referrals: Colonoscopy: Up to date, due 07/2021 Mammogram: Up to date, due 12/2019 Bone Density: Up to date, due 10/2024 Recommended yearly ophthalmology/optometry visit for glaucoma screening and checkup Recommended yearly dental visit for hygiene and checkup  Vaccinations: Influenza vaccine: Currently due, will receive at pharmacy this fall.  Pneumococcal vaccine: Completed series Tdap vaccine: Up to date, due 11/2020 Shingles vaccine: Shingrix discussed. Please contact your pharmacy for coverage information.     Advanced directives: Please bring a copy of your POA (Power of Attorney) and/or Living Will to your next appointment once completed.  Conditions/risks identified: Recommend to eat 3 small meals a day with 2 healthy snacks in between to help aid with weight loss and diabetes control.   Next appointment: None- declined scheduling a follow up with PCP or an AWV for 2022 at this time.    Preventive Care 68 Years and Older, Female Preventive care refers to lifestyle choices and visits with your health care provider that can promote health and wellness. What does preventive care include?  A yearly physical exam. This is also called an annual well check.  Dental exams once or twice a year.  Routine eye exams. Ask your health care provider how often you should have your eyes checked.  Personal lifestyle choices, including:  Daily care of your teeth and gums.  Regular physical activity.  Eating a healthy diet.  Avoiding tobacco and drug use.  Limiting alcohol use.  Practicing safe sex.  Taking low-dose aspirin every day.  Taking vitamin and mineral supplements as recommended by your health care  provider. What happens during an annual well check? The services and screenings done by your health care provider during your annual well check will depend on your age, overall health, lifestyle risk factors, and family history of disease. Counseling  Your health care provider may ask you questions about your:  Alcohol use.  Tobacco use.  Drug use.  Emotional well-being.  Home and relationship well-being.  Sexual activity.  Eating habits.  History of falls.  Memory and ability to understand (cognition).  Work and work Statistician.  Reproductive health. Screening  You may have the following tests or measurements:  Height, weight, and BMI.  Blood pressure.  Lipid and cholesterol levels. These may be checked every 5 years, or more frequently if you are over 50 years old.  Skin check.  Lung cancer screening. You may have this screening every year starting at age 71 if you have a 30-pack-year history of smoking and currently smoke or have quit within the past 15 years.  Fecal occult blood test (FOBT) of the stool. You may have this test every year starting at age 2.  Flexible sigmoidoscopy or colonoscopy. You may have a sigmoidoscopy every 5 years or a colonoscopy every 10 years starting at age 38.  Hepatitis C blood test.  Hepatitis B blood test.  Sexually transmitted disease (STD) testing.  Diabetes screening. This is done by checking your blood sugar (glucose) after you have not eaten for a while (fasting). You may have this done every 1-3 years.  Bone density scan. This is done to screen for osteoporosis. You may have this done starting at age 43.  Mammogram. This may be done every  1-2 years. Talk to your health care provider about how often you should have regular mammograms. Talk with your health care provider about your test results, treatment options, and if necessary, the need for more tests. Vaccines  Your health care provider may recommend certain  vaccines, such as:  Influenza vaccine. This is recommended every year.  Tetanus, diphtheria, and acellular pertussis (Tdap, Td) vaccine. You may need a Td booster every 10 years.  Zoster vaccine. You may need this after age 19.  Pneumococcal 13-valent conjugate (PCV13) vaccine. One dose is recommended after age 49.  Pneumococcal polysaccharide (PPSV23) vaccine. One dose is recommended after age 61. Talk to your health care provider about which screenings and vaccines you need and how often you need them. This information is not intended to replace advice given to you by your health care provider. Make sure you discuss any questions you have with your health care provider. Document Released: 02/26/2015 Document Revised: 10/20/2015 Document Reviewed: 12/01/2014 Elsevier Interactive Patient Education  2017 [place] Prevention in the Home Falls can cause injuries. They can happen to people of all ages. There are many things you can do to make your home safe and to help prevent falls. What can I do on the outside of my home?  Regularly fix the edges of walkways and driveways and fix any cracks.  Remove anything that might make you trip as you walk through a door, such as a raised step or threshold.  Trim any bushes or trees on the path to your home.  Use bright outdoor lighting.  Clear any walking paths of anything that might make someone trip, such as rocks or tools.  Regularly check to see if handrails are loose or broken. Make sure that both sides of any steps have handrails.  Any raised decks and porches should have guardrails on the edges.  Have any leaves, snow, or ice cleared regularly.  Use sand or salt on walking paths during winter.  Clean up any spills in your garage right away. This includes oil or grease spills. What can I do in the bathroom?  Use night lights.  Install grab bars by the toilet and in the tub and shower. Do not use towel bars as grab  bars.  Use non-skid mats or decals in the tub or shower.  If you need to sit down in the shower, use a plastic, non-slip stool.  Keep the floor dry. Clean up any water that spills on the floor as soon as it happens.  Remove soap buildup in the tub or shower regularly.  Attach bath mats securely with double-sided non-slip rug tape.  Do not have throw rugs and other things on the floor that can make you trip. What can I do in the bedroom?  Use night lights.  Make sure that you have a light by your bed that is easy to reach.  Do not use any sheets or blankets that are too big for your bed. They should not hang down onto the floor.  Have a firm chair that has side arms. You can use this for support while you get dressed.  Do not have throw rugs and other things on the floor that can make you trip. What can I do in the kitchen?  Clean up any spills right away.  Avoid walking on wet floors.  Keep items that you use a lot in easy-to-reach places.  If you need to reach something above you, use a strong  step stool that has a grab bar.  Keep electrical cords out of the way.  Do not use floor polish or wax that makes floors slippery. If you must use wax, use non-skid floor wax.  Do not have throw rugs and other things on the floor that can make you trip. What can I do with my stairs?  Do not leave any items on the stairs.  Make sure that there are handrails on both sides of the stairs and use them. Fix handrails that are broken or loose. Make sure that handrails are as long as the stairways.  Check any carpeting to make sure that it is firmly attached to the stairs. Fix any carpet that is loose or worn.  Avoid having throw rugs at the top or bottom of the stairs. If you do have throw rugs, attach them to the floor with carpet tape.  Make sure that you have a light switch at the top of the stairs and the bottom of the stairs. If you do not have them, ask someone to add them for  you. What else can I do to help prevent falls?  Wear shoes that:  Do not have high heels.  Have rubber bottoms.  Are comfortable and fit you well.  Are closed at the toe. Do not wear sandals.  If you use a stepladder:  Make sure that it is fully opened. Do not climb a closed stepladder.  Make sure that both sides of the stepladder are locked into place.  Ask someone to hold it for you, if possible.  Clearly mark and make sure that you can see:  Any grab bars or handrails.  First and last steps.  Where the edge of each step is.  Use tools that help you move around (mobility aids) if they are needed. These include:  Canes.  Walkers.  Scooters.  Crutches.  Turn on the lights when you go into a dark area. Replace any light bulbs as soon as they burn out.  Set up your furniture so you have a clear path. Avoid moving your furniture around.  If any of your floors are uneven, fix them.  If there are any pets around you, be aware of where they are.  Review your medicines with your doctor. Some medicines can make you feel dizzy. This can increase your chance of falling. Ask your doctor what other things that you can do to help prevent falls. This information is not intended to replace advice given to you by your health care provider. Make sure you discuss any questions you have with your health care provider. Document Released: 11/26/2008 Document Revised: 07/08/2015 Document Reviewed: 03/06/2014 Elsevier Interactive Patient Education  2017 [employer].

## 2019-12-16 ENCOUNTER — Encounter: Payer: Self-pay | Admitting: *Deleted

## 2019-12-16 ENCOUNTER — Telehealth: Payer: Self-pay | Admitting: *Deleted

## 2019-12-16 DIAGNOSIS — Z122 Encounter for screening for malignant neoplasm of respiratory organs: Secondary | ICD-10-CM

## 2019-12-16 DIAGNOSIS — Z87891 Personal history of nicotine dependence: Secondary | ICD-10-CM

## 2019-12-16 NOTE — Telephone Encounter (Signed)
Received referral for initial lung cancer screening scan. Contacted patient and obtained smoking history,(former, quit 14 years ago, 27 pack year) as well as answering questions related to screening process. Patient denies signs of lung cancer such as weight loss or hemoptysis. Patient denies comorbidity that would prevent curative treatment if lung cancer were found. Patient is scheduled for shared decision making visit and CT scan on 01/07/20.

## 2020-01-07 ENCOUNTER — Inpatient Hospital Stay: Payer: Medicare Other | Attending: Oncology | Admitting: Oncology

## 2020-01-07 ENCOUNTER — Ambulatory Visit
Admission: RE | Admit: 2020-01-07 | Discharge: 2020-01-07 | Disposition: A | Discharge status: Home / Self Care | Payer: Medicare Other | Source: Ambulatory Visit | Attending: Oncology | Admitting: Oncology

## 2020-01-07 ENCOUNTER — Other Ambulatory Visit: Payer: Self-pay

## 2020-01-07 DIAGNOSIS — Z122 Encounter for screening for malignant neoplasm of respiratory organs: Secondary | ICD-10-CM | POA: Insufficient documentation

## 2020-01-07 DIAGNOSIS — Z87891 Personal history of nicotine dependence: Secondary | ICD-10-CM | POA: Diagnosis not present

## 2020-01-07 NOTE — Progress Notes (Signed)
Virtual Visit via Video Note  I connected with Mrs. Habibi on 01/07/20 at  9:30 AM EST by a video enabled telemedicine application and verified that I am speaking with the correct person using two identifiers.  Location: Patient: Home Provider: Clinic    I discussed the limitations of evaluation and management by telemedicine and the availability of in person appointments. The patient expressed understanding and agreed to proceed.  I discussed the assessment and treatment plan with the patient. The patient was provided an opportunity to ask questions and all were answered. The patient agreed with the plan and demonstrated an understanding of the instructions.   The patient was advised to call back or seek an in-person evaluation if the symptoms worsen or if the condition fails to improve as anticipated.   In accordance with CMS guidelines, patient has met eligibility criteria including age, absence of signs or symptoms of lung cancer.  Social History   Tobacco Use  . Smoking status: Former Smoker    Packs/day: 0.75    Years: 36.00    Pack years: 27.00    Types: Cigarettes    Quit date: 02/13/2005    Years since quitting: 14.9  . Smokeless tobacco: Never Used  Vaping Use  . Vaping Use: Never used  Substance Use Topics  . Alcohol use: No  . Drug use: No      A shared decision-making session was conducted prior to the performance of CT scan. This includes one or more decision aids, includes benefits and harms of screening, follow-up diagnostic testing, over-diagnosis, false positive rate, and total radiation exposure.   Counseling on the importance of adherence to annual lung cancer LDCT screening, impact of co-morbidities, and ability or willingness to undergo diagnosis and treatment is imperative for compliance of the program.   Counseling on the importance of continued smoking cessation for former smokers; the importance of smoking cessation for current smokers, and information  about tobacco cessation interventions have been given to patient including [facility]ie and 1800 quit [place] programs.   Written order for lung cancer screening with LDCT has been given to the patient and any and all questions have been answered to the best of my abilities.    Yearly follow up will be coordinated by Burgess Estelle, Thoracic Navigator.  I provided 15 minutes of face-to-face video visit time during this encounter, and > 50% was spent counseling as documented under my assessment & plan.   Jacquelin Hawking, NP

## 2020-01-14 ENCOUNTER — Encounter: Payer: Self-pay | Admitting: *Deleted

## 2020-02-10 ENCOUNTER — Encounter: Payer: Self-pay | Admitting: Physician Assistant

## 2020-02-15 ENCOUNTER — Other Ambulatory Visit: Payer: Self-pay | Admitting: Physician Assistant

## 2020-02-15 DIAGNOSIS — E7800 Pure hypercholesterolemia, unspecified: Secondary | ICD-10-CM

## 2020-02-16 DIAGNOSIS — E1129 Type 2 diabetes mellitus with other diabetic kidney complication: Secondary | ICD-10-CM | POA: Diagnosis not present

## 2020-02-16 DIAGNOSIS — Z794 Long term (current) use of insulin: Secondary | ICD-10-CM | POA: Diagnosis not present

## 2020-02-16 DIAGNOSIS — R809 Proteinuria, unspecified: Secondary | ICD-10-CM | POA: Diagnosis not present

## 2020-02-23 DIAGNOSIS — E1169 Type 2 diabetes mellitus with other specified complication: Secondary | ICD-10-CM | POA: Diagnosis not present

## 2020-02-23 DIAGNOSIS — E1129 Type 2 diabetes mellitus with other diabetic kidney complication: Secondary | ICD-10-CM | POA: Diagnosis not present

## 2020-02-23 DIAGNOSIS — Z794 Long term (current) use of insulin: Secondary | ICD-10-CM | POA: Diagnosis not present

## 2020-02-23 DIAGNOSIS — I10 Essential (primary) hypertension: Secondary | ICD-10-CM | POA: Diagnosis not present

## 2020-02-23 DIAGNOSIS — E785 Hyperlipidemia, unspecified: Secondary | ICD-10-CM | POA: Diagnosis not present

## 2020-02-23 DIAGNOSIS — R809 Proteinuria, unspecified: Secondary | ICD-10-CM | POA: Diagnosis not present

## 2020-02-23 DIAGNOSIS — E1165 Type 2 diabetes mellitus with hyperglycemia: Secondary | ICD-10-CM | POA: Diagnosis not present

## 2020-05-08 ENCOUNTER — Other Ambulatory Visit: Payer: Self-pay | Admitting: Physician Assistant

## 2020-05-08 DIAGNOSIS — E7800 Pure hypercholesterolemia, unspecified: Secondary | ICD-10-CM

## 2020-05-08 NOTE — Telephone Encounter (Signed)
No Future visit scheduled. Approved per protocol.  Requested Prescriptions  Pending Prescriptions Disp Refills  . atorvastatin (LIPITOR) 10 MG tablet [Pharmacy Med Name: Atorvastatin Calcium 10 MG Oral Tablet] 90 tablet 0    Sig: Take 1 tablet by mouth once daily     Cardiovascular:  Antilipid - Statins Failed - 05/08/2020  2:21 PM      Failed - Total Cholesterol in normal range and within 360 days    Cholesterol, Total  Date Value Ref Range Status  10/25/2018 130 100 - 199 mg/dL Final         Failed - LDL in normal range and within 360 days    LDL Chol Calc (NIH)  Date Value Ref Range Status  10/25/2018 65 0 - 99 mg/dL Final         Failed - HDL in normal range and within 360 days    HDL  Date Value Ref Range Status  10/25/2018 39 (L) >39 mg/dL Final         Failed - Triglycerides in normal range and within 360 days    Triglycerides  Date Value Ref Range Status  10/25/2018 148 0 - 149 mg/dL Final         Passed - Patient is not pregnant      Passed - Valid encounter within last 12 months    Recent Outpatient Visits          8 months ago Hypercholesterolemia   [place], Clearnce Sorrel, [place]   3 years ago Annual physical exam   [place], Clearnce Sorrel, [place]   3 years ago Need for influenza vaccination   [place], [place], [place]   3 years ago Acute cystitis without hematuria   [place], Clearnce Sorrel, [place]   3 years ago [facility] discharge follow-up   [place], [place], [place]

## 2020-05-31 LAB — HM DIABETES EYE EXAM

## 2020-06-03 ENCOUNTER — Encounter: Payer: Self-pay | Admitting: Family Medicine

## 2020-06-07 DIAGNOSIS — E1129 Type 2 diabetes mellitus with other diabetic kidney complication: Secondary | ICD-10-CM | POA: Diagnosis not present

## 2020-06-07 DIAGNOSIS — R809 Proteinuria, unspecified: Secondary | ICD-10-CM | POA: Diagnosis not present

## 2020-06-07 DIAGNOSIS — Z794 Long term (current) use of insulin: Secondary | ICD-10-CM | POA: Diagnosis not present

## 2020-06-14 DIAGNOSIS — I10 Essential (primary) hypertension: Secondary | ICD-10-CM | POA: Diagnosis not present

## 2020-06-14 DIAGNOSIS — E1169 Type 2 diabetes mellitus with other specified complication: Secondary | ICD-10-CM | POA: Diagnosis not present

## 2020-06-14 DIAGNOSIS — R809 Proteinuria, unspecified: Secondary | ICD-10-CM | POA: Diagnosis not present

## 2020-06-14 DIAGNOSIS — E785 Hyperlipidemia, unspecified: Secondary | ICD-10-CM | POA: Diagnosis not present

## 2020-06-14 DIAGNOSIS — Z794 Long term (current) use of insulin: Secondary | ICD-10-CM | POA: Diagnosis not present

## 2020-06-14 DIAGNOSIS — E1129 Type 2 diabetes mellitus with other diabetic kidney complication: Secondary | ICD-10-CM | POA: Diagnosis not present

## 2020-08-13 ENCOUNTER — Other Ambulatory Visit: Payer: Self-pay | Admitting: Physician Assistant

## 2020-08-13 DIAGNOSIS — E7800 Pure hypercholesterolemia, unspecified: Secondary | ICD-10-CM

## 2020-10-19 DIAGNOSIS — E1129 Type 2 diabetes mellitus with other diabetic kidney complication: Secondary | ICD-10-CM | POA: Diagnosis not present

## 2020-10-19 DIAGNOSIS — I10 Essential (primary) hypertension: Secondary | ICD-10-CM | POA: Diagnosis not present

## 2020-10-19 DIAGNOSIS — R809 Proteinuria, unspecified: Secondary | ICD-10-CM | POA: Diagnosis not present

## 2020-10-19 DIAGNOSIS — Z794 Long term (current) use of insulin: Secondary | ICD-10-CM | POA: Diagnosis not present

## 2020-10-19 LAB — BASIC METABOLIC PANEL
BUN: 26 — AB (ref 4–21)
CO2: 26 — AB (ref 13–22)
Chloride: 104 (ref 99–108)
Creatinine: 1 (ref <=1.1)
Glucose: 83
Potassium: 4.4 (ref 3.4–5.3)
Sodium: 138 (ref 137–147)

## 2020-10-19 LAB — LIPID PANEL
Cholesterol: 137 (ref 0–200)
HDL: 37 (ref 35–70)
LDL Cholesterol: 66
LDl/HDL Ratio: 3.7
Triglycerides: 171 — AB (ref 40–160)

## 2020-10-19 LAB — COMPREHENSIVE METABOLIC PANEL: GFR calc Af Amer: 66

## 2020-10-19 LAB — HEMOGLOBIN A1C: Hemoglobin A1C: 7.8

## 2020-10-21 ENCOUNTER — Ambulatory Visit (INDEPENDENT_AMBULATORY_CARE_PROVIDER_SITE_OTHER): Payer: Medicare Other | Admitting: Family Medicine

## 2020-10-21 ENCOUNTER — Encounter: Payer: Self-pay | Admitting: Family Medicine

## 2020-10-21 ENCOUNTER — Other Ambulatory Visit: Payer: Self-pay

## 2020-10-21 VITALS — BP 135/80 | HR 82 | Temp 98.5°F | Ht 65.5 in | Wt 208.9 lb

## 2020-10-21 DIAGNOSIS — E1159 Type 2 diabetes mellitus with other circulatory complications: Secondary | ICD-10-CM

## 2020-10-21 DIAGNOSIS — G8929 Other chronic pain: Secondary | ICD-10-CM

## 2020-10-21 DIAGNOSIS — M545 Low back pain, unspecified: Secondary | ICD-10-CM | POA: Insufficient documentation

## 2020-10-21 DIAGNOSIS — E1100 Type 2 diabetes mellitus with hyperosmolarity without nonketotic hyperglycemic-hyperosmolar coma (NKHHC): Secondary | ICD-10-CM | POA: Diagnosis not present

## 2020-10-21 DIAGNOSIS — H6121 Impacted cerumen, right ear: Secondary | ICD-10-CM | POA: Diagnosis not present

## 2020-10-21 DIAGNOSIS — M5440 Lumbago with sciatica, unspecified side: Secondary | ICD-10-CM

## 2020-10-21 DIAGNOSIS — S40021A Contusion of right upper arm, initial encounter: Secondary | ICD-10-CM

## 2020-10-21 DIAGNOSIS — I152 Hypertension secondary to endocrine disorders: Secondary | ICD-10-CM | POA: Diagnosis not present

## 2020-10-21 DIAGNOSIS — R1310 Dysphagia, unspecified: Secondary | ICD-10-CM

## 2020-10-21 DIAGNOSIS — H6123 Impacted cerumen, bilateral: Secondary | ICD-10-CM | POA: Insufficient documentation

## 2020-10-21 DIAGNOSIS — N393 Stress incontinence (female) (male): Secondary | ICD-10-CM | POA: Diagnosis not present

## 2020-10-21 NOTE — Assessment & Plan Note (Addendum)
-   Chronic and stable, A1c today is 6.8 - Continue following with endocrinology - UTD on labs, eye exam, & foot exam - Continue Novolog, Levemir, & Metformin

## 2020-10-21 NOTE — Progress Notes (Signed)
Established patient visit   Patient: Julie [name]   DOB: Apr 30, 1950   70 y.o. Female  MRN: [id number] Visit Date: 10/21/2020  Today's healthcare provider: Lavon Paganini, MD   Chief Complaint  Patient presents with   ear irritation   Back Pain   Subjective    HPI  Bilateral Ear Irritation - Pt. reports itching and irritation in both ears, as well as mild hearing loss for the past few months - Has a history of prior cerumen impaction; states that she doesn't use Q-tips anymore  Hematoma on R forearm - Pt. noticed small "bump" at venipuncture site on R forearm after getting blood drawn 2 days ago  Dysphagia - Pt. reports that she gets "choked" on water, saliva, food for the past 3-4 months - Denies history of GERD; denies chest pain, nausea, and vomiting  Stress Urinary Incontinence - Pt. reports urine leakage with coughing, sneezing, and laughing for the past year - She denies vaginal itching, dysuria, suprapubic tenderness, & hematuria - History of 3 vaginal deliveries  Right Lower Back Pain - Pt. describes "nerve pain" localized to R lower back & R posterior hip - She reports worsening pain after performing repetitive "twisting" motions while at work   Medications: Outpatient Medications Prior to Visit  Medication Sig   Ascorbic Acid (VITAMIN C PO) Take by mouth daily.    atorvastatin (LIPITOR) 10 MG tablet Take 1 tablet by mouth once daily   Cholecalciferol 25 MCG (1000 UT) tablet Take 1,000 Units by mouth daily.    Continuous Blood Gluc Sensor (FREESTYLE LIBRE 14 DAY SENSOR) MISC Use 1 kit every 14 (fourteen) days   Cyanocobalamin (VITAMIN B-12 PO) Take by mouth daily.    enalapril (VASOTEC) 20 MG tablet Take 40 mg by mouth daily.    insulin aspart (NOVOLOG) 100 UNIT/ML FlexPen Inject into the skin 3 (three) times daily with meals. Takes 18 units @ breakfast, 18 units before lunch and 26 units before dinner.   insulin detemir (LEVEMIR) 100 UNIT/ML  FlexPen Inject 52 Units into the skin at bedtime.    metFORMIN (GLUCOPHAGE) 500 MG tablet Take 1,000 mg by mouth 2 (two) times daily with a meal.    hydrochlorothiazide (HYDRODIURIL) 25 MG tablet Take by mouth.   [DISCONTINUED] naproxen (NAPROSYN) 500 MG tablet TAKE 1 TABLET BY MOUTH TWICE DAILY WITH MEALS (Patient not taking: Reported on 10/21/2020)   No facility-administered medications prior to visit.    Review of Systems  Constitutional:  Negative for activity change, appetite change, fatigue and fever.  HENT:  Positive for hearing loss and trouble swallowing.   Eyes: Negative.  Negative for visual disturbance.  Respiratory: Negative.  Negative for chest tightness and shortness of breath.   Cardiovascular: Negative.  Negative for chest pain and leg swelling.  Gastrointestinal: Negative.  Negative for nausea and vomiting.  Endocrine: Negative.  Negative for polydipsia and polyuria.  Genitourinary: Negative.  Negative for dysuria and urgency.       Stress urinary incontinence  Musculoskeletal:  Positive for back pain.  Skin: Negative.   Neurological: Negative.       Objective    BP 135/80   Pulse 82   Temp 98.5 F (36.9 C) (Oral)   Ht 5' 5.5" (1.664 m)   Wt 208 lb 14.4 oz (94.8 kg)   BMI 34.23 kg/m  {Show previous vital signs (optional):23777}   Physical Exam Constitutional:      General: She is not in acute  distress.    Appearance: Normal appearance. She is obese.  HENT:     Head: Normocephalic and atraumatic.     Right Ear: Tympanic membrane, ear canal and external ear normal. There is impacted cerumen.     Left Ear: Tympanic membrane, ear canal and external ear normal. There is no impacted cerumen.  Eyes:     Conjunctiva/sclera: Conjunctivae normal.  Cardiovascular:     Rate and Rhythm: Normal rate and regular rhythm.     Pulses: Normal pulses.     Heart sounds: Normal heart sounds.  Pulmonary:     Effort: Pulmonary effort is normal.     Breath sounds: Normal  breath sounds.  Abdominal:     General: Abdomen is flat. Bowel sounds are normal.     Palpations: Abdomen is soft.  Musculoskeletal:        General: No swelling, tenderness or deformity.     Lumbar back: No deformity, signs of trauma or bony tenderness.     Right lower leg: No edema.     Left lower leg: No edema.  Skin:    General: Skin is warm and dry.  Neurological:     Mental Status: She is alert.  Psychiatric:        Behavior: Behavior normal.        Thought Content: Thought content normal.     Results for orders placed or performed in visit on 67/61/95  Basic metabolic panel  Result Value Ref Range   Glucose 83    BUN 26 (A) 4 - 21   CO2 26 (A) 13 - 22   Creatinine 1.0 0.5 - 1.1   Potassium 4.4 3.4 - 5.3   Sodium 138 137 - 147   Chloride 104 99 - 108  Comprehensive metabolic panel  Result Value Ref Range   GFR calc Af Amer 66   Lipid panel  Result Value Ref Range   LDl/HDL Ratio 3.7    Triglycerides 171 (A) 40 - 160   Cholesterol 137 0 - 200   HDL 37 35 - 70   LDL Cholesterol 66   Hemoglobin A1c  Result Value Ref Range   Hemoglobin A1C 7.8     Assessment & Plan     Problem List Items Addressed This Visit       Cardiovascular and Mediastinum   Hypertension associated with diabetes ([place])    - Chronic and stable; BP recheck was 135/80 - Continue Enalapril & HCTZ - UTD on labs - Will continue to monitor        Digestive   Dysphagia - Primary    - Chronic, uncomplicated swallowing difficulty - Low suspicion for esophageal pathology due to swallowing difficulties of both liquids and solid foods at the start of each swallow (feels as though she becomes "choked" before successfully swallowing) - Recommended pt. to drink thickened liquids to prevent choking - Refer for swallowing study      Relevant Orders   DG ESOPHAGUS W SINGLE CM (SOL OR THIN BA)     Endocrine   Type 2 diabetes mellitus (HCC)    - Chronic and stable, A1c today is 6.8 - Continue  following with endocrinology - UTD on labs, eye exam, & foot exam - Continue Novolog, Levemir, & Metformin        Nervous and Auditory   Bilateral impacted cerumen    - Acute, uncomplicated problem - Successful R ear lavage in office today - Will continue to monitor  Other   Urinary, incontinence, stress female    - Chronic, uncomplicated stress urinary incontinence - Risk factors include history of multiple vaginal deliveries and age demographic - Low suspicion for underlying bladder pathology due to absence of dysuria, hematuria, and suprapubic tenderness - Refer to PT for pelvic floor strengthening       Relevant Orders   Ambulatory referral to Physical Therapy   Chronic lower back pain    - Chronic R LBP with recent acute exacerbations, likely 2/2 muscle strain - Pain does not radiate elsewhere so lower suspicion for lumbar radiculopathy or sciatica - Recommended pt. to avoid twisting motions while at work - Encouraged pt. to use Tylenol and/or NSAIDs prn for pain relief - Will continue to monitor        Return in about 3 months (around 01/20/2021) for AWV, chronic disease f/u, With new PCP.       Percell Locus, MS3   Patient seen along with MS3 student Percell Locus. I personally evaluated this patient along with the student, and verified all aspects of the history, physical exam, and medical decision making as documented by the student. I agree with the student's documentation and have made all necessary edits.  Alexie Lanni, Dionne Bucy, MD, MPH [facility]

## 2020-10-21 NOTE — Assessment & Plan Note (Addendum)
-   Chronic R LBP with recent acute exacerbations, likely 2/2 muscle strain - Pain does not radiate elsewhere so lower suspicion for lumbar radiculopathy or sciatica - Recommended pt. to avoid twisting motions while at work - Encouraged pt. to use Tylenol and/or NSAIDs prn for pain relief - Will continue to monitor

## 2020-10-21 NOTE — Assessment & Plan Note (Signed)
-   Chronic and stable; BP recheck was 135/80 - Continue Enalapril & HCTZ - UTD on labs - Will continue to monitor

## 2020-10-21 NOTE — Assessment & Plan Note (Addendum)
-   Chronic, uncomplicated stress urinary incontinence - Risk factors include history of multiple vaginal deliveries and age demographic - Low suspicion for underlying bladder pathology due to absence of dysuria, hematuria, and suprapubic tenderness - Refer to PT for pelvic floor strengthening

## 2020-10-21 NOTE — Assessment & Plan Note (Signed)
-   Small, benign, ~1 inch hematoma located on dorsal surface of R forearm, 2/2 recent venipuncture - Counseled pt. that hematoma will likely self-resolve with time - Return to clinic if symptoms worsen or persist

## 2020-10-21 NOTE — Assessment & Plan Note (Addendum)
-   Chronic, uncomplicated swallowing difficulty - Low suspicion for esophageal pathology due to swallowing difficulties of both liquids and solid foods at the start of each swallow (feels as though she becomes "choked" before successfully swallowing) - Recommended pt. to drink thickened liquids to prevent choking - Refer for swallowing study

## 2020-10-21 NOTE — Assessment & Plan Note (Addendum)
-   Acute, uncomplicated problem - Successful bilateral ear lavage in office today - Will continue to monitor

## 2020-10-25 DIAGNOSIS — R809 Proteinuria, unspecified: Secondary | ICD-10-CM | POA: Diagnosis not present

## 2020-10-25 DIAGNOSIS — E1129 Type 2 diabetes mellitus with other diabetic kidney complication: Secondary | ICD-10-CM | POA: Diagnosis not present

## 2020-10-25 DIAGNOSIS — I10 Essential (primary) hypertension: Secondary | ICD-10-CM | POA: Diagnosis not present

## 2020-10-25 DIAGNOSIS — Z794 Long term (current) use of insulin: Secondary | ICD-10-CM | POA: Diagnosis not present

## 2020-10-25 DIAGNOSIS — E785 Hyperlipidemia, unspecified: Secondary | ICD-10-CM | POA: Diagnosis not present

## 2020-10-25 DIAGNOSIS — E1169 Type 2 diabetes mellitus with other specified complication: Secondary | ICD-10-CM | POA: Diagnosis not present

## 2020-12-15 ENCOUNTER — Ambulatory Visit: Payer: Medicare Other

## 2020-12-22 ENCOUNTER — Ambulatory Visit: Payer: Medicare Other

## 2020-12-28 ENCOUNTER — Ambulatory Visit: Payer: Medicare Other

## 2021-01-20 ENCOUNTER — Encounter: Payer: Medicare Other | Admitting: Family Medicine

## 2021-01-25 DIAGNOSIS — Z794 Long term (current) use of insulin: Secondary | ICD-10-CM | POA: Diagnosis not present

## 2021-01-25 DIAGNOSIS — E1129 Type 2 diabetes mellitus with other diabetic kidney complication: Secondary | ICD-10-CM | POA: Diagnosis not present

## 2021-01-25 DIAGNOSIS — R809 Proteinuria, unspecified: Secondary | ICD-10-CM | POA: Diagnosis not present

## 2021-01-31 DIAGNOSIS — E785 Hyperlipidemia, unspecified: Secondary | ICD-10-CM | POA: Diagnosis not present

## 2021-01-31 DIAGNOSIS — E1129 Type 2 diabetes mellitus with other diabetic kidney complication: Secondary | ICD-10-CM | POA: Diagnosis not present

## 2021-01-31 DIAGNOSIS — E1169 Type 2 diabetes mellitus with other specified complication: Secondary | ICD-10-CM | POA: Diagnosis not present

## 2021-01-31 DIAGNOSIS — R809 Proteinuria, unspecified: Secondary | ICD-10-CM | POA: Diagnosis not present

## 2021-01-31 DIAGNOSIS — I10 Essential (primary) hypertension: Secondary | ICD-10-CM | POA: Diagnosis not present

## 2021-01-31 DIAGNOSIS — E1165 Type 2 diabetes mellitus with hyperglycemia: Secondary | ICD-10-CM | POA: Diagnosis not present

## 2021-01-31 DIAGNOSIS — Z794 Long term (current) use of insulin: Secondary | ICD-10-CM | POA: Diagnosis not present

## 2021-02-23 ENCOUNTER — Ambulatory Visit
Admission: RE | Admit: 2021-02-23 | Discharge: 2021-02-23 | Disposition: A | Discharge status: Home / Self Care | Payer: Medicare Other | Source: Ambulatory Visit | Attending: Family Medicine | Admitting: Family Medicine

## 2021-02-23 ENCOUNTER — Encounter: Payer: Self-pay | Admitting: Family Medicine

## 2021-02-23 ENCOUNTER — Ambulatory Visit (INDEPENDENT_AMBULATORY_CARE_PROVIDER_SITE_OTHER): Payer: Medicare Other | Admitting: Family Medicine

## 2021-02-23 ENCOUNTER — Other Ambulatory Visit: Payer: Self-pay

## 2021-02-23 ENCOUNTER — Other Ambulatory Visit: Payer: Self-pay | Admitting: Family Medicine

## 2021-02-23 ENCOUNTER — Ambulatory Visit
Admission: RE | Admit: 2021-02-23 | Discharge: 2021-02-23 | Disposition: A | Discharge status: Home / Self Care | Payer: Medicare Other | Attending: Family Medicine | Admitting: Family Medicine

## 2021-02-23 VITALS — BP 148/82 | HR 81 | Temp 98.1°F | Resp 18 | Ht 66.0 in | Wt 207.0 lb

## 2021-02-23 DIAGNOSIS — M25559 Pain in unspecified hip: Secondary | ICD-10-CM

## 2021-02-23 DIAGNOSIS — R1030 Lower abdominal pain, unspecified: Secondary | ICD-10-CM | POA: Diagnosis not present

## 2021-02-23 MED ORDER — MELOXICAM 15 MG PO TABS: 15.0000 mg | ORAL_TABLET | Freq: Every day | ORAL | 0 refills | Status: DC

## 2021-02-23 NOTE — Assessment & Plan Note (Addendum)
Arthritis vs pelvic muscle weakness. Obtain XR to evaluate joint space. Meloxicam provided for pain relief. Would likely also benefit from PT to strengthen pelvic musculature, discussed, will obtain imaging first.

## 2021-02-23 NOTE — Progress Notes (Signed)
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 Duration: days Involved hip: left  Mechanism of injury: unknown Location:  groin Onset: gradual, worse after working/walking more at school. Quality: sharp Radiation:  maybe started in knee. Sometimes goes down leg.  Aggravating factors: weight bearing   Alleviating factors: lidocaine, aleve, tylenol Treatments attempted: aleve, tylenol, lidocaine Relief with NSAIDs?: mild Weakness with weight bearing:  no Weakness with walking: no Paresthesias / decreased sensation: no Swelling: no Redness:no Fevers: no Rashes: no   OBJECTIVE:   BP (!) 148/82 (BP Location: Right Arm, Patient Position: Sitting, Cuff Size: Large)    Pulse 81    Temp 98.1 F (36.7 C) (Temporal)    Resp 18    Ht 5\' 6"  (1.676 m)    Wt 207 lb (93.9 kg)    SpO2 92%    BMI 33.41 kg/m   Gen: well appearing, in NAD MSK: no asymmetry. No appreciable swelling. +FADIR/FABER on L. No tenderness over piriformis or greater trochanter. Negative straight leg raise. +[place] test.   ASSESSMENT/PLAN:   Hip pain Arthritis vs pelvic muscle weakness. Obtain XR to evaluate joint space. Meloxicam provided for pain relief. Would likely also benefit from PT to strengthen pelvic musculature, discussed, will obtain imaging first.      Myles Gip, DO

## 2021-02-25 ENCOUNTER — Ambulatory Visit: Payer: Self-pay | Admitting: *Deleted

## 2021-02-25 NOTE — Telephone Encounter (Signed)
Summary: hip pain / muscle relaxer   Patient states meloxicam (MOBIC) 15 MG tablet and lidocaine is not working, x ray results reflecting no fracture or arthritis , patient requesting a muscle relaxer for pain    [place], [place]  Phone: [phone]  Fax: [phone]      Answer Assessment - Initial Assessment Questions 1. LOCATION and RADIATION: "Where is the pain located?"      Left side- inner thigh/groin area 2. QUALITY: "What does the pain feel like?"  (e.g., sharp, dull, aching, burning)     Aching pain- especially with moving 3. SEVERITY: "How bad is the pain?" "What does it keep you from doing?"   (Scale 1-10; or mild, moderate, severe)   -  MILD (1-3): doesn't interfere with normal activities    -  MODERATE (4-7): interferes with normal activities (e.g., work or school) or awakens from sleep, limping    -  SEVERE (8-10): excruciating pain, unable to do any normal activities, unable to walk     moderate 4. ONSET: "When did the pain start?" "Does it come and go, or is it there all the time?"     Started last Thursday 5. WORK OR EXERCISE: "Has there been any recent work or exercise that involved this part of the body?"      Patient did walk- last week - more han usual 6. CAUSE: "What do you think is causing the hip pain?"      Not sure- after X-ray patient thinks maybe muscular pain 7. AGGRAVATING FACTORS: "What makes the hip pain worse?" (e.g., walking, climbing stairs, running)     walking 8. OTHER SYMPTOMS: "Do you have any other symptoms?" (e.g., back pain, pain shooting down leg,  fever, rash)     Sometimes does travel into leg- patient has brace she uses for leg pain.  Protocols used: Hip Pain-A-AH

## 2021-02-25 NOTE — Telephone Encounter (Signed)
°  Chief Complaint: left hip/groin pain Symptoms: pain with walking Frequency: started last Thursday  Pertinent Negatives: Patient denies back pain, fever, rash Disposition: [] ED /[] Urgent Care (no appt availability in office) / [] Appointment(In office/virtual)/ []  [facility]/ [x] Home Care/ [] Refused Recommended Disposition /[]  Mobile Bus/ []  Follow-up with PCP Additional Notes: Patient requesting medication change for her pain

## 2021-02-28 NOTE — Telephone Encounter (Signed)
Patient was seen by you on 02/23/21 for hip pain in your assessment you wrote "Arthritis vs pelvic muscle weakness. Obtain XR to evaluate joint space. Meloxicam provided for pain relief. Would likely also benefit from PT to strengthen pelvic musculature, discussed, will obtain imaging first."   Patient is requesting a muscle relaxer now for pain with relief, stating to nurse triage that meloxicam and lidocaine are not helping with pain control.

## 2021-02-28 NOTE — Telephone Encounter (Signed)
Lmtcb okay for [facility] nurse triage to advise patient of Dr. Ky Barban message below. KW

## 2021-03-21 DIAGNOSIS — Z20822 Contact with and (suspected) exposure to covid-19: Secondary | ICD-10-CM | POA: Diagnosis not present

## 2021-03-30 ENCOUNTER — Ambulatory Visit: Payer: Self-pay | Admitting: *Deleted

## 2021-03-30 DIAGNOSIS — J069 Acute upper respiratory infection, unspecified: Secondary | ICD-10-CM | POA: Diagnosis not present

## 2021-03-30 DIAGNOSIS — R070 Pain in throat: Secondary | ICD-10-CM | POA: Diagnosis not present

## 2021-03-30 NOTE — Telephone Encounter (Signed)
Patient calling for medication list- she is at urgent care and wants to make sure her information is correct.  Read through medication list with patient and information is complete. Patient reports she is not taking metformin or meloxicam although still on medication list. Reason for Disposition  Health Information question, no triage required and triager able to answer question  Answer Assessment - Initial Assessment Questions 1. REASON FOR CALL or QUESTION: "What is your reason for calling today?" or "How can I best help you?" or "What question do you have that I can help answer?"     Medication list  Protocols used: Information Only Call - No Triage-A-AH

## 2021-05-05 ENCOUNTER — Telehealth: Payer: Self-pay | Admitting: *Deleted

## 2021-05-05 ENCOUNTER — Ambulatory Visit (INDEPENDENT_AMBULATORY_CARE_PROVIDER_SITE_OTHER): Payer: Medicare Other

## 2021-05-05 VITALS — Wt 207.0 lb

## 2021-05-05 DIAGNOSIS — Z1211 Encounter for screening for malignant neoplasm of colon: Secondary | ICD-10-CM | POA: Diagnosis not present

## 2021-05-05 DIAGNOSIS — Z1231 Encounter for screening mammogram for malignant neoplasm of breast: Secondary | ICD-10-CM | POA: Diagnosis not present

## 2021-05-05 DIAGNOSIS — Z0000 Encounter for general adult medical examination without abnormal findings: Secondary | ICD-10-CM

## 2021-05-05 DIAGNOSIS — Z20822 Contact with and (suspected) exposure to covid-19: Secondary | ICD-10-CM | POA: Diagnosis not present

## 2021-05-05 NOTE — Patient Instructions (Addendum)
Ms. Alkema , ?Thank you for taking time to come for your Medicare Wellness Visit. I appreciate your ongoing commitment to your health goals. Please review the following plan we discussed and let me know if I can assist you in the future.  ? ?Screening recommendations/referrals: ?Colonoscopy: 07/31/16, referral sent ?Mammogram: 12/18/18, referral sent ?Bone Density: 11/13/19 ?Recommended yearly ophthalmology/optometry visit for glaucoma screening and checkup ?Recommended yearly dental visit for hygiene and checkup ? ?Vaccinations: ?Influenza vaccine: n/d ?Pneumococcal vaccine: 03/26/17 ?Tdap vaccine: 11/30/10 ?Shingles vaccine: n/d   ?Covid-19:03/28/19, 04/25/19 ? ?Advanced directives: no ? ?Conditions/risks identified: none ? ?Next appointment: Follow up in one year for your annual wellness visit - 05/10/22 @ 10:30am by phone ? ? ?Preventive Care 72 Years and Older, Female ?Preventive care refers to lifestyle choices and visits with your health care provider that can promote health and wellness. ?What does preventive care include? ?A yearly physical exam. This is also called an annual well check. ?Dental exams once or twice a year. ?Routine eye exams. Ask your health care provider how often you should have your eyes checked. ?Personal lifestyle choices, including: ?Daily care of your teeth and gums. ?Regular physical activity. ?Eating a healthy diet. ?Avoiding tobacco and drug use. ?Limiting alcohol use. ?Practicing safe sex. ?Taking low-dose aspirin every day. ?Taking vitamin and mineral supplements as recommended by your health care provider. ?What happens during an annual well check? ?The services and screenings done by your health care provider during your annual well check will depend on your age, overall health, lifestyle risk factors, and family history of disease. ?Counseling  ?Your health care provider may ask you questions about your: ?Alcohol use. ?Tobacco use. ?Drug use. ?Emotional well-being. ?Home and  relationship well-being. ?Sexual activity. ?Eating habits. ?History of falls. ?Memory and ability to understand (cognition). ?Work and work Statistician. ?Reproductive health. ?Screening  ?You may have the following tests or measurements: ?Height, weight, and BMI. ?Blood pressure. ?Lipid and cholesterol levels. These may be checked every 5 years, or more frequently if you are over 45 years old. ?Skin check. ?Lung cancer screening. You may have this screening every year starting at age 75 if you have a 30-pack-year history of smoking and currently smoke or have quit within the past 15 years. ?Fecal occult blood test (FOBT) of the stool. You may have this test every year starting at age 76. ?Flexible sigmoidoscopy or colonoscopy. You may have a sigmoidoscopy every 5 years or a colonoscopy every 10 years starting at age 28. ?Hepatitis C blood test. ?Hepatitis B blood test. ?Sexually transmitted disease (STD) testing. ?Diabetes screening. This is done by checking your blood sugar (glucose) after you have not eaten for a while (fasting). You may have this done every 1-3 years. ?Bone density scan. This is done to screen for osteoporosis. You may have this done starting at age 23. ?Mammogram. This may be done every 1-2 years. Talk to your health care provider about how often you should have regular mammograms. ?Talk with your health care provider about your test results, treatment options, and if necessary, the need for more tests. ?Vaccines  ?Your health care provider may recommend certain vaccines, such as: ?Influenza vaccine. This is recommended every year. ?Tetanus, diphtheria, and acellular pertussis (Tdap, Td) vaccine. You may need a Td booster every 10 years. ?Zoster vaccine. You may need this after age 36. ?Pneumococcal 13-valent conjugate (PCV13) vaccine. One dose is recommended after age 62. ?Pneumococcal polysaccharide (PPSV23) vaccine. One dose is recommended after age 33. ?Talk to  your health care provider  about which screenings and vaccines you need and how often you need them. ?This information is not intended to replace advice given to you by your health care provider. Make sure you discuss any questions you have with your health care provider. ?Document Released: 02/26/2015 Document Revised: 10/20/2015 Document Reviewed: 12/01/2014 ?Elsevier Interactive Patient Education ? 2017 [place]. ? ?Fall Prevention in the Home ?Falls can cause injuries. They can happen to people of all ages. There are many things you can do to make your home safe and to help prevent falls. ?What can I do on the outside of my home? ?Regularly fix the edges of walkways and driveways and fix any cracks. ?Remove anything that might make you trip as you walk through a door, such as a raised step or threshold. ?Trim any bushes or trees on the path to your home. ?Use bright outdoor lighting. ?Clear any walking paths of anything that might make someone trip, such as rocks or tools. ?Regularly check to see if handrails are loose or broken. Make sure that both sides of any steps have handrails. ?Any raised decks and porches should have guardrails on the edges. ?Have any leaves, snow, or ice cleared regularly. ?Use sand or salt on walking paths during winter. ?Clean up any spills in your garage right away. This includes oil or grease spills. ?What can I do in the bathroom? ?Use night lights. ?Install grab bars by the toilet and in the tub and shower. Do not use towel bars as grab bars. ?Use non-skid mats or decals in the tub or shower. ?If you need to sit down in the shower, use a plastic, non-slip stool. ?Keep the floor dry. Clean up any water that spills on the floor as soon as it happens. ?Remove soap buildup in the tub or shower regularly. ?Attach bath mats securely with double-sided non-slip rug tape. ?Do not have throw rugs and other things on the floor that can make you trip. ?What can I do in the bedroom? ?Use night lights. ?Make sure  that you have a light by your bed that is easy to reach. ?Do not use any sheets or blankets that are too big for your bed. They should not hang down onto the floor. ?Have a firm chair that has side arms. You can use this for support while you get dressed. ?Do not have throw rugs and other things on the floor that can make you trip. ?What can I do in the kitchen? ?Clean up any spills right away. ?Avoid walking on wet floors. ?Keep items that you use a lot in easy-to-reach places. ?If you need to reach something above you, use a strong step stool that has a grab bar. ?Keep electrical cords out of the way. ?Do not use floor polish or wax that makes floors slippery. If you must use wax, use non-skid floor wax. ?Do not have throw rugs and other things on the floor that can make you trip. ?What can I do with my stairs? ?Do not leave any items on the stairs. ?Make sure that there are handrails on both sides of the stairs and use them. Fix handrails that are broken or loose. Make sure that handrails are as long as the stairways. ?Check any carpeting to make sure that it is firmly attached to the stairs. Fix any carpet that is loose or worn. ?Avoid having throw rugs at the top or bottom of the stairs. If you do have throw rugs, attach  them to the floor with carpet tape. ?Make sure that you have a light switch at the top of the stairs and the bottom of the stairs. If you do not have them, ask someone to add them for you. ?What else can I do to help prevent falls? ?Wear shoes that: ?Do not have high heels. ?Have rubber bottoms. ?Are comfortable and fit you well. ?Are closed at the toe. Do not wear sandals. ?If you use a stepladder: ?Make sure that it is fully opened. Do not climb a closed stepladder. ?Make sure that both sides of the stepladder are locked into place. ?Ask someone to hold it for you, if possible. ?Clearly mark and make sure that you can see: ?Any grab bars or handrails. ?First and last steps. ?Where the edge of  each step is. ?Use tools that help you move around (mobility aids) if they are needed. These include: ?Canes. ?Walkers. ?Scooters. ?Crutches. ?Turn on the lights when you go into a dark area. Replace any light b

## 2021-05-05 NOTE — Progress Notes (Signed)
?Virtual Visit via Telephone Note ? ?I connected with  Julie [name] on 05/05/21 at 10:20 AM EDT by telephone and verified that I am speaking with the correct person using two identifiers. ? ?Location: ?Patient: home ?Provider: [facility] ?Persons participating in the virtual visit: patient/Nurse Health Advisor ?  ?I discussed the limitations, risks, security and privacy concerns of performing an evaluation and management service by telephone and the availability of in person appointments. The patient expressed understanding and agreed to proceed. ? ?Interactive audio and video telecommunications were attempted between this nurse and patient, however failed, due to patient having technical difficulties OR patient did not have access to video capability.  We continued and completed visit with audio only. ? ?Some vital signs may be absent or patient reported.  ? ?Dionisio David, LPN ? ?Subjective:  ? Julie [name] is a 71 y.o. female who presents for Medicare Annual (Subsequent) preventive examination. ? ?Review of Systems    ? ?  ? ?   ?Objective:  ?  ?There were no vitals filed for this visit. ?There is no height or weight on file to calculate BMI. ? ? ?  11/19/2019  ? 11:22 AM 11/23/2018  ?  5:53 PM 11/23/2018  ?  2:25 PM 07/07/2018  ? 12:37 PM 08/23/2017  ?  3:10 PM 12/29/2016  ?  1:41 PM 12/27/2016  ?  8:46 AM  ?Advanced Directives  ?Does Patient Have a Medical Advance Directive? No No No No No No No  ?Would patient like information on creating a medical advance directive? No - Patient declined No - Patient declined  No - Patient declined Yes (MAU/Ambulatory/Procedural Areas - Information given) No - Patient declined   ? ? ?Current Medications (verified) ?Outpatient Encounter Medications as of 05/05/2021  ?Medication Sig  ? enalapril (VASOTEC) 20 MG tablet Take 2 tablets by mouth daily.  ? insulin detemir (LEVEMIR FLEXTOUCH) 100 UNIT/ML FlexPen Inject into the skin.  ? Ascorbic Acid (VITAMIN C PO) Take by mouth daily.    ? atorvastatin (LIPITOR) 10 MG tablet Take 1 tablet by mouth once daily  ? Cholecalciferol 25 MCG (1000 UT) tablet Take 1,000 Units by mouth daily.   ? Continuous Blood Gluc Sensor (FREESTYLE LIBRE 14 DAY SENSOR) MISC Use 1 kit every 14 (fourteen) days  ? Continuous Blood Gluc Sensor (FREESTYLE LIBRE 2 SENSOR) MISC Use 1 kit every 14 (fourteen) days for glucose monitoring  ? Cyanocobalamin (VITAMIN B-12 PO) Take by mouth daily.   ? enalapril (VASOTEC) 20 MG tablet Take 40 mg by mouth daily.   ? hydrochlorothiazide (HYDRODIURIL) 25 MG tablet Take by mouth.  ? insulin aspart (NOVOLOG) 100 UNIT/ML FlexPen Inject into the skin 3 (three) times daily with meals. Takes 18 units @ breakfast, 18 units before lunch and 26 units before dinner.  ? insulin detemir (LEVEMIR) 100 UNIT/ML FlexPen Inject 52 Units into the skin at bedtime.   ? meloxicam (MOBIC) 15 MG tablet Take 1 tablet (15 mg total) by mouth daily.  ? metFORMIN (GLUCOPHAGE) 500 MG tablet Take 1,000 mg by mouth 2 (two) times daily with a meal.  (Patient not taking: Reported on 02/23/2021)  ? Semaglutide,0.25 or 0.5MG/DOS, 2 MG/1.5ML SOPN Inject 0.5 mg into the skin.  ? ?No facility-administered encounter medications on file as of 05/05/2021.  ? ? ?Allergies (verified) ?Patient has no known allergies.  ? ?History: ?Past Medical History:  ?Diagnosis Date  ? Anemia   ? Arthritis   ? Chronic kidney disease   ?  Diabetes mellitus without complication ([place])   ? 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 Hepatitis   ? hepatitis c, treated  ? History of peptic ulcer disease   ? Hypertension   ? Microalbuminuria   ? ?Past Surgical History:  ?Procedure Laterality Date  ? ABDOMINAL HYSTERECTOMY  1985  ? COLONOSCOPY WITH PROPOFOL N/A 07/31/2016  ? Procedure: COLONOSCOPY WITH PROPOFOL;  Surgeon: Lollie Sails, MD;  Location: [facility] ENDOSCOPY;  Service: Endoscopy;  Laterality: N/A;  ? DG  BONE DENSITY ([place] [facility])    ? EXCISION MASS NECK Left 12/29/2016  ? Procedure: CHEST WALL MASS EXCISION;   Surgeon: Robert Bellow, MD;  Location: [facility] ORS;  Service: General;  Laterality: Left;  ? LAPAROSCOPIC HYSTERECTOMY    ? assisted vaginal hysterectomy, fibroids, ovaries removed  ? LIVER BIOPSY  2009  ? + hepatitis C, referred to [facility] by Gustavo Lah 04/2008.  ? NEPHROSTOMY W/ INTRODUCTION OF CATHETER    ? TUBAL LIGATION    ? ?Family History  ?Problem Relation Age of Onset  ? Arthritis Mother   ? Hypertension Mother   ? Stroke Father   ? Alcohol abuse Father   ? Cirrhosis Father   ? Cancer Maternal Aunt   ? Diabetes Son   ? Pancreatic disease Brother   ? Healthy Brother   ? Healthy Brother   ? Breast cancer Neg Hx   ? ?Social History  ? ?Socioeconomic History  ? Marital status: Married  ?  Spouse name: Not on file  ? Number of children: 4  ? Years of education: Not on file  ? Highest education level: Associate degree: occupational, Hotel manager, or vocational program  ?Occupational History  ? Occupation: Driving a school bus  ?  Comment: full time (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)  ?Tobacco Use  ? Smoking status: Former  ?  Packs/day: 0.75  ?  Years: 36.00  ?  Pack years: 27.00  ?  Types: Cigarettes  ?  Quit date: 02/13/2005  ?  Years since quitting: 16.2  ? Smokeless tobacco: Never  ?Vaping Use  ? Vaping Use: Never used  ?Substance and Sexual Activity  ? Alcohol use: No  ? Drug use: No  ? Sexual activity: Yes  ?Other Topics Concern  ? Not on file  ?Social History Narrative  ? Not on file  ? ?Social Determinants of Health  ? ?Financial Resource Strain: Not on file  ?Food Insecurity: Not on file  ?Transportation Needs: Not on file  ?Physical Activity: Not on file  ?Stress: Not on file  ?Social Connections: Not on file  ? ? ?Tobacco Counseling ?Counseling given: Not Answered ? ? ?Clinical Intake: ? ?Pre-visit preparation completed: No ? ?Pain : No/denies pain ? ?  ? ?Nutritional Risks: None ?Diabetes: Yes ?CBG done?: No ?Did pt. bring in CBG monitor from home?: No ? ?How often do you need to have someone help you when you read  instructions, pamphlets, or other written materials from your doctor or pharmacy?: 1 - Never ? ?Diabetic?yes ?Nutrition Risk Assessment: ? ?Has the patient had any N/V/D within the last 2 months?  No  ?Does the patient have any non-healing wounds?  No  ?Has the patient had any unintentional weight loss or weight gain?  No  ? ?Diabetes: ? ?Is the patient diabetic?  Yes  ?If diabetic, was a CBG obtained today?  No  ?Did the patient bring in their glucometer from home?  No  ?How often do you monitor your CBG's?  3-4xday.  ? ?Financial Strains and Diabetes Management: ? ?Are you having any financial strains with the device, your supplies or your medication? No .  ?Does the patient want to be seen by Chronic Care Management for management of their diabetes?  No  ?Would the patient like to be referred to a Nutritionist or for Diabetic Management?  No  ? ?Diabetic Exams: ? ?Diabetic Eye Exam: Completed 05/31/20. Pt has been advised about the importance in completing this exam.  ? ?Diabetic Foot Exam: Completed 11/04/19. Pt has been advised about the importance in completing this exam.  ? ?Interpreter Needed?: No ? ?Information entered by :: Kirke Shaggy, LPN ? ? ?Activities of Daily Living ? ?  02/23/2021  ? 11:42 AM 10/21/2020  ?  8:25 AM  ?In your present state of health, do you have any difficulty performing the following activities:  ?Hearing? 0 1  ?Comment  sometimes, ear irritation 6 months  ?Vision? 0 0  ?Difficulty concentrating or making decisions? 0 0  ?Walking or climbing stairs? 1 0  ?Dressing or bathing? 0 0  ?Doing errands, shopping? 0 0  ? ? ?Patient Care Team: ?Gwyneth Sprout, FNP as PCP - General (Family Medicine) ?Robert Bellow, MD as Consulting Physician (General Surgery) ?Ocie Doyne, OD (Optometry) ?Judi Cong, MD as Physician Assistant (Endocrinology) ? ?Indicate any recent Medical Services you may have received from other than [facility] providers in the past year (date may be approximate). ? ?    ?Assessment:  ? This is a routine wellness examination for [facility]. ? ?Hearing/Vision screen ?No results found. ? ?Dietary issues and exercise activities discussed: ?  ? ? Goals Addressed   ?None ?  ? ?[place]

## 2021-05-05 NOTE — Telephone Encounter (Signed)
LMTC to schedule Yearly Lung CA CT Scan. 

## 2021-05-09 ENCOUNTER — Other Ambulatory Visit: Payer: Self-pay

## 2021-05-09 DIAGNOSIS — Z86010 Personal history of colonic polyps: Secondary | ICD-10-CM

## 2021-05-09 MED ORDER — PEG 3350-KCL-NA BICARB-NACL 420 G PO SOLR: 4000.0000 mL | Freq: Once | ORAL | 0 refills | Status: AC

## 2021-05-09 NOTE — Progress Notes (Signed)
Gastroenterology Pre-Procedure Review ? ?Request Date: 05/26/2021 ?Requesting Physician: Dr. Vicente Males ? ?PATIENT REVIEW QUESTIONS: The patient responded to the following health history questions as indicated:   ? ?1. Are you having any GI issues? no ?2. Do you have a personal history of Polyps? yes (LAST COLONOSCOPY) ?3. Do you have a family history of Colon Cancer or Polyps? no ?4. Diabetes Mellitus? yes (TYPE 2) ?5. Joint replacements in the past 12 months?no ?6. Major health problems in the past 3 months?no ?7. Any artificial heart valves, MVP, or defibrillator?no ?   ?MEDICATIONS & ALLERGIES:    ?Patient reports the following regarding taking any anticoagulation/antiplatelet therapy:   ?Plavix, Coumadin, Eliquis, Xarelto, Lovenox, Pradaxa, Brilinta, or Effient? no ?Aspirin? no ? ?Patient confirms/reports the following medications:  ?Current Outpatient Medications  ?Medication Sig Dispense Refill  ? Ascorbic Acid (VITAMIN C PO) Take by mouth daily.  (Patient not taking: Reported on 05/05/2021)    ? atorvastatin (LIPITOR) 10 MG tablet Take 1 tablet by mouth once daily 90 tablet 0  ? Cholecalciferol 25 MCG (1000 UT) tablet Take 1,000 Units by mouth daily.     ? Continuous Blood Gluc Sensor (FREESTYLE LIBRE 14 DAY SENSOR) MISC Use 1 kit every 14 (fourteen) days    ? Continuous Blood Gluc Sensor (FREESTYLE LIBRE 2 SENSOR) MISC Use 1 kit every 14 (fourteen) days for glucose monitoring    ? Cyanocobalamin (VITAMIN B-12 PO) Take by mouth daily.  (Patient not taking: Reported on 05/05/2021)    ? enalapril (VASOTEC) 20 MG tablet Take 40 mg by mouth daily.     ? hydrochlorothiazide (HYDRODIURIL) 25 MG tablet Take by mouth.    ? insulin aspart (NOVOLOG) 100 UNIT/ML FlexPen Inject into the skin 3 (three) times daily with meals. Takes 18 units @ breakfast, 18 units before lunch and 26 units before dinner.    ? insulin detemir (LEVEMIR FLEXTOUCH) 100 UNIT/ML FlexPen Inject into the skin.    ? insulin detemir (LEVEMIR) 100 UNIT/ML  FlexPen Inject 52 Units into the skin at bedtime.     ? meloxicam (MOBIC) 15 MG tablet Take 1 tablet (15 mg total) by mouth daily. 30 tablet 0  ? metFORMIN (GLUCOPHAGE) 500 MG tablet Take 1,000 mg by mouth 2 (two) times daily with a meal.  (Patient not taking: Reported on 02/23/2021)    ? Semaglutide,0.25 or 0.5MG/DOS, 2 MG/1.5ML SOPN Inject 0.5 mg into the skin.    ? ?No current facility-administered medications for this visit.  ? ? ?Patient confirms/reports the following allergies:  ?No Known Allergies ? ?No orders of the defined types were placed in this encounter. ? ? ?AUTHORIZATION INFORMATION ?Primary Insurance: ?1D#: ?Group #: ? ?Secondary Insurance: ?1D#: ?Group #: ? ?SCHEDULE INFORMATION: ?Date: 05/26/2021 ?Time: ?[place] ? ?

## 2021-05-17 DIAGNOSIS — E1165 Type 2 diabetes mellitus with hyperglycemia: Secondary | ICD-10-CM | POA: Diagnosis not present

## 2021-05-24 ENCOUNTER — Encounter: Payer: Self-pay | Admitting: Gastroenterology

## 2021-05-24 DIAGNOSIS — R809 Proteinuria, unspecified: Secondary | ICD-10-CM | POA: Diagnosis not present

## 2021-05-24 DIAGNOSIS — E785 Hyperlipidemia, unspecified: Secondary | ICD-10-CM | POA: Diagnosis not present

## 2021-05-24 DIAGNOSIS — Z794 Long term (current) use of insulin: Secondary | ICD-10-CM | POA: Diagnosis not present

## 2021-05-24 DIAGNOSIS — I10 Essential (primary) hypertension: Secondary | ICD-10-CM | POA: Diagnosis not present

## 2021-05-24 DIAGNOSIS — E1169 Type 2 diabetes mellitus with other specified complication: Secondary | ICD-10-CM | POA: Diagnosis not present

## 2021-05-24 DIAGNOSIS — E1129 Type 2 diabetes mellitus with other diabetic kidney complication: Secondary | ICD-10-CM | POA: Diagnosis not present

## 2021-05-26 ENCOUNTER — Ambulatory Visit: Payer: Medicare Other | Admitting: Anesthesiology

## 2021-05-26 ENCOUNTER — Ambulatory Visit
Admission: RE | Admit: 2021-05-26 | Discharge: 2021-05-26 | Disposition: A | Discharge status: Home / Self Care | Payer: Medicare Other | Attending: Gastroenterology | Admitting: Gastroenterology

## 2021-05-26 ENCOUNTER — Encounter
Admission: RE | Disposition: A | Discharge status: Home / Self Care | Payer: Self-pay | Source: Home / Self Care | Attending: Gastroenterology

## 2021-05-26 DIAGNOSIS — E1122 Type 2 diabetes mellitus with diabetic chronic kidney disease: Secondary | ICD-10-CM | POA: Diagnosis not present

## 2021-05-26 DIAGNOSIS — Z79899 Other long term (current) drug therapy: Secondary | ICD-10-CM | POA: Insufficient documentation

## 2021-05-26 DIAGNOSIS — I129 Hypertensive chronic kidney disease with stage 1 through stage 4 chronic kidney disease, or unspecified chronic kidney disease: Secondary | ICD-10-CM | POA: Diagnosis not present

## 2021-05-26 DIAGNOSIS — Z86010 Personal history of colonic polyps: Secondary | ICD-10-CM | POA: Diagnosis not present

## 2021-05-26 DIAGNOSIS — D122 Benign neoplasm of ascending colon: Secondary | ICD-10-CM | POA: Diagnosis not present

## 2021-05-26 DIAGNOSIS — Z7984 Long term (current) use of oral hypoglycemic drugs: Secondary | ICD-10-CM | POA: Insufficient documentation

## 2021-05-26 DIAGNOSIS — Z87891 Personal history of nicotine dependence: Secondary | ICD-10-CM | POA: Diagnosis not present

## 2021-05-26 DIAGNOSIS — K635 Polyp of colon: Secondary | ICD-10-CM | POA: Insufficient documentation

## 2021-05-26 DIAGNOSIS — N189 Chronic kidney disease, unspecified: Secondary | ICD-10-CM | POA: Insufficient documentation

## 2021-05-26 DIAGNOSIS — Z794 Long term (current) use of insulin: Secondary | ICD-10-CM | POA: Diagnosis not present

## 2021-05-26 DIAGNOSIS — Z1211 Encounter for screening for malignant neoplasm of colon: Secondary | ICD-10-CM | POA: Diagnosis not present

## 2021-05-26 DIAGNOSIS — E7800 Pure hypercholesterolemia, unspecified: Secondary | ICD-10-CM | POA: Diagnosis not present

## 2021-05-26 DIAGNOSIS — D120 Benign neoplasm of cecum: Secondary | ICD-10-CM | POA: Diagnosis not present

## 2021-05-26 HISTORY — PX: COLONOSCOPY WITH PROPOFOL: SHX5780

## 2021-05-26 LAB — GLUCOSE, CAPILLARY: Glucose-Capillary: 155 mg/dL — ABNORMAL HIGH (ref 70–99)

## 2021-05-26 SURGERY — COLONOSCOPY WITH PROPOFOL
Anesthesia: General

## 2021-05-26 MED ORDER — PROPOFOL 10 MG/ML IV BOLUS
INTRAVENOUS | Status: DC | PRN
  Administered 2021-05-26: 80 mg via INTRAVENOUS
  Administered 2021-05-26: 20 mg via INTRAVENOUS

## 2021-05-26 MED ORDER — PROPOFOL 500 MG/50ML IV EMUL
INTRAVENOUS | Status: DC | PRN
  Administered 2021-05-26: 120 ug/kg/min via INTRAVENOUS

## 2021-05-26 MED ORDER — SODIUM CHLORIDE 0.9 % IV SOLN: INTRAVENOUS | Status: DC

## 2021-05-26 MED ORDER — LIDOCAINE HCL (CARDIAC) PF 100 MG/5ML IV SOSY
PREFILLED_SYRINGE | INTRAVENOUS | Status: DC | PRN
  Administered 2021-05-26: 50 mg via INTRAVENOUS

## 2021-05-26 NOTE — H&P (Signed)
?Jonathon Bellows, MD ?[address] [place], [place], [place], [address] ?[address], [place], [place], [place], [address] ?Phone: [phone]  ?Fax: [phone] ? ?Primary Care Physician:  Gwyneth Sprout, FNP ? ? ?Pre-Procedure History & Physical: ?HPI:  Julie [name] is a 71 y.o. female is here for an colonoscopy. ?  ?Past Medical History:  ?Diagnosis Date  ? Anemia   ? Arthritis   ? Chronic kidney disease   ? Diabetes mellitus without complication ([place])   ? 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 Hepatitis   ? hepatitis c, treated  ? History of peptic ulcer disease   ? Hypertension   ? Microalbuminuria   ? ? ?Past Surgical History:  ?Procedure Laterality Date  ? ABDOMINAL HYSTERECTOMY  1985  ? COLONOSCOPY WITH PROPOFOL N/A 07/31/2016  ? Procedure: COLONOSCOPY WITH PROPOFOL;  Surgeon: Lollie Sails, MD;  Location: [facility] ENDOSCOPY;  Service: Endoscopy;  Laterality: N/A;  ? DG  BONE DENSITY ([place] HX)    ? EXCISION MASS NECK Left 12/29/2016  ? Procedure: CHEST WALL MASS EXCISION;  Surgeon: Robert Bellow, MD;  Location: [facility] ORS;  Service: General;  Laterality: Left;  ? LAPAROSCOPIC HYSTERECTOMY    ? assisted vaginal hysterectomy, fibroids, ovaries removed  ? LIVER BIOPSY  2009  ? + hepatitis C, referred to [facility] by Gustavo Lah 04/2008.  ? NEPHROSTOMY W/ INTRODUCTION OF CATHETER    ? TUBAL LIGATION    ? ? ?Prior to Admission medications   ?Medication Sig Start Date End Date Taking? Authorizing Provider  ?insulin aspart (NOVOLOG) 100 UNIT/ML FlexPen Inject into the skin 3 (three) times daily with meals. Takes 18 units @ breakfast, 18 units before lunch and 26 units before dinner. 08/14/17  Yes [provider]  ?insulin detemir (LEVEMIR) 100 UNIT/ML FlexPen Inject 52 Units into the skin at bedtime.    Yes [provider]  ?Ascorbic Acid (VITAMIN C PO) Take by mouth daily.  ?Patient not taking: Reported on 05/05/2021    [provider]  ?atorvastatin (LIPITOR) 10 MG tablet Take 1 tablet by mouth  once daily 08/23/20   Virginia Crews, MD  ?Cholecalciferol 25 MCG (1000 UT) tablet Take 1,000 Units by mouth daily.     [provider]  ?Continuous Blood Gluc Sensor (FREESTYLE LIBRE 14 DAY SENSOR) MISC Use 1 kit every 14 (fourteen) days 08/14/17   [provider]  ?Continuous Blood Gluc Sensor (FREESTYLE LIBRE 2 SENSOR) MISC Use 1 kit every 14 (fourteen) days for glucose monitoring 10/25/20   [provider]  ?Cyanocobalamin (VITAMIN B-12 PO) Take by mouth daily.  ?Patient not taking: Reported on 05/05/2021    [provider]  ?enalapril (VASOTEC) 20 MG tablet Take 40 mg by mouth daily.  07/15/13   [provider]  ?hydrochlorothiazide (HYDRODIURIL) 25 MG tablet Take by mouth. 08/19/18 02/23/21  [provider]  ?insulin detemir (LEVEMIR FLEXTOUCH) 100 UNIT/ML FlexPen Inject into the skin. 10/25/20   [provider]  ?meloxicam (MOBIC) 15 MG tablet Take 1 tablet (15 mg total) by mouth daily. ?Patient not taking: Reported on 05/24/2021 02/23/21   Myles Gip, DO  ?metFORMIN (GLUCOPHAGE) 500 MG tablet Take 1,000 mg by mouth 2 (two) times daily with a meal.  ?Patient not taking: Reported on 02/23/2021 11/16/11   [provider]  ?Semaglutide,0.25 or 0.5MG/DOS, 2 MG/1.5ML SOPN Inject 0.5 mg into the skin. 01/31/21   [provider]  ? ? ?Allergies as of 05/09/2021  ? (No  Known Allergies)  ? ? ?Family History  ?Problem Relation Age of Onset  ? Arthritis Mother   ? Hypertension Mother   ? Stroke Father   ? Alcohol abuse Father   ? Cirrhosis Father   ? Cancer Maternal Aunt   ? Diabetes Son   ? Pancreatic disease Brother   ? Healthy Brother   ? Healthy Brother   ? Breast cancer Neg Hx   ? ? ?Social History  ? ?Socioeconomic History  ? Marital status: Married  ?  Spouse name: Not on file  ? Number of children: 4  ? Years of education: Not on file  ? Highest education level: Associate degree: occupational, Hotel manager, or vocational program   ?Occupational History  ? Occupation: Driving a school bus  ?  Comment: full time (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)  ?Tobacco Use  ? Smoking status: Former  ?  Packs/day: 0.75  ?  Years: 36.00  ?  Pack years: 27.00  ?  Types: Cigarettes  ?  Quit date: 02/13/2005  ?  Years since quitting: 16.2  ? Smokeless tobacco: Never  ?Vaping Use  ? Vaping Use: Never used  ?Substance and Sexual Activity  ? Alcohol use: No  ? Drug use: No  ? Sexual activity: Yes  ?Other Topics Concern  ? Not on file  ?Social History Narrative  ? Not on file  ? ?Social Determinants of Health  ? ?Financial Resource Strain: Low Risk   ? Difficulty of Paying Living Expenses: Not hard at all  ?Food Insecurity: No Food Insecurity  ? Worried About Charity fundraiser in the Last Year: Never true  ? Ran Out of Food in the Last Year: Never true  ?Transportation Needs: No Transportation Needs  ? Lack of Transportation (Medical): No  ? Lack of Transportation (Non-Medical): No  ?Physical Activity: Insufficiently Active  ? Days of Exercise per Week: 2 days  ? Minutes of Exercise per Session: 20 min  ?Stress: No Stress Concern Present  ? Feeling of Stress : Not at all  ?Social Connections: Moderately Integrated  ? Frequency of Communication with Friends and Family: Three times a week  ? Frequency of Social Gatherings with Friends and Family: Twice a week  ? Attends Religious Services: More than 4 times per year  ? Active Member of Clubs or Organizations: No  ? Attends Archivist Meetings: Never  ? Marital Status: Married  ?Intimate Partner Violence: Not At Risk  ? Fear of Current or Ex-Partner: No  ? Emotionally Abused: No  ? Physically Abused: No  ? Sexually Abused: No  ? ? ?Review of Systems: ?See HPI, otherwise negative ROS ? ?Physical Exam: ?BP (!) 170/94   Pulse 74   Temp 97.9 ?F (36.6 ?C) (Temporal)   Resp 18   Ht 5' 6" (1.676 m)   Wt 95.3 kg   SpO2 100%   BMI 33.89 kg/m?  ?General:   Alert,  pleasant and cooperative in NAD ?Head:  Normocephalic  and atraumatic. ?Neck:  Supple; no masses or thyromegaly. ?Lungs:  Clear throughout to auscultation, normal respiratory effort.    ?Heart:  +S1, +S2, Regular rate and rhythm, No edema. ?Abdomen:  Soft, nontender and nondistended. Normal bowel sounds, without guarding, and without rebound.   ?Neurologic:  Alert and  oriented x4;  grossly normal neurologically. ? ?Impression/Plan: ?[name] is here for an colonoscopy to be performed for surveillance due to prior history of colon polyps  ? ?Risks, benefits,  limitations, and alternatives regarding  colonoscopy have been reviewed with the patient.  Questions have been answered.  All parties agreeable. ? ? ?Jonathon Bellows, MD  05/26/2021, 10:06 AM ? ?

## 2021-05-26 NOTE — Op Note (Signed)
[facility] ?Gastroenterology ?Patient Name: Julie [name] ?Procedure Date: 05/26/2021 10:00 AM ?MRN: [id number] ?Account #: [id number] ?Date of Birth: [birth year] ?Admit Type: Outpatient ?Age: 71 ?Room: [facility] ENDO ROOM 3 ?Gender: Female ?Note Status: Finalized ?Instrument Name: Colonoscope [id number] ?Procedure:             Colonoscopy ?Indications:           Surveillance: Personal history of adenomatous polyps  ?                       on last colonoscopy > 3 years ago ?Providers:             Jonathon Bellows MD, MD ?Referring MD:          Jaci Standard. Rollene Rotunda (Referring MD) ?Medicines:             Monitored Anesthesia Care ?Complications:         No immediate complications. ?Procedure:             Pre-Anesthesia Assessment: ?                       - Prior to the procedure, a History and Physical was  ?                       performed, and patient medications, allergies and  ?                       sensitivities were reviewed. The patient's tolerance  ?                       of previous anesthesia was reviewed. ?                       - The risks and benefits of the procedure and the  ?                       sedation options and risks were discussed with the  ?                       patient. All questions were answered and informed  ?                       consent was obtained. ?                       - ASA Grade Assessment: II - A patient with mild  ?                       systemic disease. ?                       After obtaining informed consent, the colonoscope was  ?                       passed under direct vision. Throughout the procedure,  ?                       the patient's blood pressure, pulse, and oxygen  ?  saturations were monitored continuously. The  ?                       Colonoscope was introduced through the anus and  ?                       advanced to the the cecum, identified by the  ?                       appendiceal orifice. The colonoscopy was performed  ?                        with ease. The patient tolerated the procedure well.  ?                       The quality of the bowel preparation was good. ?Findings: ?     The perianal and digital rectal examinations were normal. ?     Two sessile polyps were found in the ascending colon. The polyps were 4  ?     to 6 mm in size. These polyps were removed with a cold snare. Resection  ?     and retrieval were complete. ?     A 3 mm polyp was found in the descending colon. The polyp was sessile.  ?     The polyp was removed with a cold snare. Resection and retrieval were  ?     complete. ?     The exam was otherwise without abnormality on direct and retroflexion  ?     views. ?Impression:            - Two 4 to 6 mm polyps in the ascending colon, removed  ?                       with a cold snare. Resected and retrieved. ?                       - One 3 mm polyp in the descending colon, removed with  ?                       a cold snare. Resected and retrieved. ?                       - The examination was otherwise normal on direct and  ?                       retroflexion views. ?Recommendation:        - Discharge patient to home (with escort). ?                       - Resume previous diet. ?                       - Continue present medications. ?                       - Await pathology results. ?                       - Repeat colonoscopy in 5 years for surveillance. ?Procedure Code(s):     ---  Professional --- ?                       [id number], Colonoscopy, flexible; with removal of  ?                       tumor(s), polyp(s), or other lesion(s) by snare  ?                       technique ?Diagnosis Code(s):     --- Professional --- ?                       K63.5, Polyp of colon ?                       Z86.010, Personal history of colonic polyps ?CPT copyright 2019 American Medical Association. All rights reserved. ?The codes documented in this report are preliminary and upon coder review may  ?be revised to meet current compliance  requirements. ?Jonathon Bellows, MD ?Jonathon Bellows MD, MD ?05/26/2021 10:36:55 AM ?This report has been signed electronically. ?Number of Addenda: 0 ?Note Initiated On: 05/26/2021 10:00 AM ?Scope Withdrawal Time: 0 hours 16 minutes 40 seconds  ?Total Procedure Duration: 0 hours 22 minutes 21 seconds  ?Estimated Blood Loss:  Estimated blood loss: none. ?     [facility] ?

## 2021-05-26 NOTE — Transfer of Care (Signed)
Immediate Anesthesia Transfer of Care Note ? ?Patient: Julie [name] ? ?Procedure(s) Performed: COLONOSCOPY WITH PROPOFOL ? ?Patient Location: PACU and Endoscopy Unit ? ?Anesthesia Type:General ? ?Level of Consciousness: drowsy and patient cooperative ? ?Airway & Oxygen Therapy: Patient Spontanous Breathing ? ?Post-op Assessment: Report given to RN and Post -op Vital signs reviewed and stable ? ?Post vital signs: Reviewed and stable ? ?Last Vitals:  ?Vitals Value Taken Time  ?BP 139/74 05/26/21 1040  ?Temp    ?Pulse 75 05/26/21 1040  ?Resp 16 05/26/21 1040  ?SpO2 98 % 05/26/21 1040  ? ? ?Last Pain:  ?Vitals:  ? 05/26/21 0916  ?TempSrc: Temporal  ?PainSc: 0-No pain  ?   ? ?  ? ?Complications: No notable events documented. ?

## 2021-05-26 NOTE — Anesthesia Postprocedure Evaluation (Signed)
Anesthesia Post Note ? ?Patient: Julie [name] ? ?Procedure(s) Performed: COLONOSCOPY WITH PROPOFOL ? ?Patient location during evaluation: PACU ?Anesthesia Type: General ?Level of consciousness: awake and alert ?Pain management: pain level controlled ?Vital Signs Assessment: post-procedure vital signs reviewed and stable ?Respiratory status: spontaneous breathing, nonlabored ventilation, respiratory function stable and patient connected to nasal cannula oxygen ?Cardiovascular status: blood pressure returned to baseline and stable ?Postop Assessment: no apparent nausea or vomiting ?Anesthetic complications: no ? ? ?No notable events documented. ? ? ?Last Vitals:  ?Vitals:  ? 05/26/21 1050 05/26/21 1100  ?BP: (!) 150/91 (!) 168/90  ?Pulse: 76 71  ?Resp: 16 15  ?Temp:    ?SpO2: 100% 100%  ?  ?Last Pain:  ?Vitals:  ? 05/26/21 0916  ?TempSrc: Temporal  ?PainSc: 0-No pain  ? ? ?  ?  ?  ?  ?  ?  ? ?Julie [name] ? ? ? ? ?

## 2021-05-26 NOTE — Anesthesia Preprocedure Evaluation (Signed)
Anesthesia Evaluation  ?Patient identified by MRN, date of birth, ID band ?Patient awake ? ? ? ?Reviewed: ?Allergy & Precautions, H&P , NPO status , Patient's Chart, lab work & pertinent test results, reviewed documented beta blocker date and time  ? ?Airway ?Mallampati: II ? ? ?Neck ROM: full ? ? ? Dental ? ?(+) Poor Dentition ?  ?Pulmonary ?neg pulmonary ROS, former smoker,  ?  ?Pulmonary exam normal ? ? ? ? ? ? ? Cardiovascular ?Exercise Tolerance: Poor ?hypertension, On Medications ?negative cardio ROS ?Normal cardiovascular exam ?Rhythm:regular Rate:Normal ? ? ?  ?Neuro/Psych ?negative neurological ROS ? negative psych ROS  ? GI/Hepatic ?negative GI ROS, (+) Hepatitis -  ?Endo/Other  ?negative endocrine ROSdiabetes, Well Controlled ? Renal/GU ?Renal disease  ?negative genitourinary ?  ?Musculoskeletal ? ? Abdominal ?  ?Peds ? Hematology ? 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    Comment:  hepatitis c, treated ?No date: History of peptic ulcer disease ?No date: Hypertension ?No date: Microalbuminuria ?Past Surgical History: ?1985: ABDOMINAL HYSTERECTOMY ?07/31/2016: COLONOSCOPY WITH PROPOFOL; N/A ?    Comment:  Procedure: COLONOSCOPY WITH PROPOFOL;  Surgeon:  ?             Lollie Sails, MD;  Location: [facility] ENDOSCOPY;   ?             Service: Endoscopy;  Laterality: N/A; ?No date: DG  BONE DENSITY ([place] HX) ?12/29/2016: EXCISION MASS NECK; Left ?    Comment:  Procedure: CHEST WALL MASS EXCISION;  Surgeon: Bary Castilla,  ?             Forest Gleason, MD;  Location: [facility] ORS;  Service: General;   ?             Laterality: Left; ?No date: LAPAROSCOPIC HYSTERECTOMY ?    Comment:  assisted vaginal hysterectomy, fibroids, ovaries removed ?2009: LIVER BIOPSY ?     Comment:  + hepatitis C, referred to [facility] by [facility] 04/2008. ?No date: NEPHROSTOMY W/ INTRODUCTION OF CATHETER ?No date: TUBAL LIGATION ?BMI   ? Body Mass Index: 33.89 kg/m?  ?  ? Reproductive/Obstetrics ?negative OB ROS ? ?  ? ? ? ? ? ? ? ? ? ? ? ? ? ?  ?  ? ? ? ? ? ? ? ? ?Anesthesia Physical ?Anesthesia Plan ? ?ASA: 3 ? ?Anesthesia Plan: General  ? ?Post-op Pain Management:   ? ?Induction:  ? ?PONV Risk Score and Plan:  ? ?Airway Management Planned:  ? ?Additional Equipment:  ? ?Intra-op Plan:  ? ?Post-operative Plan:  ? ?Informed Consent: I have reviewed the patients History and Physical, chart, labs and discussed the procedure including the risks, benefits and alternatives for the proposed anesthesia with the patient or authorized representative who has indicated his/her understanding and acceptance.  ? ? ? ?Dental Advisory Given ? ?Plan Discussed with: CRNA ? ?Anesthesia Plan Comments:   ? ? ? ? ? ? ?Anesthesia Quick Evaluation ? ?

## 2021-05-27 ENCOUNTER — Encounter: Payer: Self-pay | Admitting: Gastroenterology

## 2021-05-27 LAB — SURGICAL PATHOLOGY

## 2021-06-06 ENCOUNTER — Encounter: Payer: Self-pay | Admitting: Gastroenterology

## 2021-06-27 DIAGNOSIS — Z0100 Encounter for examination of eyes and vision without abnormal findings: Secondary | ICD-10-CM | POA: Diagnosis not present

## 2021-06-27 DIAGNOSIS — H43813 Vitreous degeneration, bilateral: Secondary | ICD-10-CM | POA: Diagnosis not present

## 2021-06-27 DIAGNOSIS — H40003 Preglaucoma, unspecified, bilateral: Secondary | ICD-10-CM | POA: Diagnosis not present

## 2021-06-27 DIAGNOSIS — H2513 Age-related nuclear cataract, bilateral: Secondary | ICD-10-CM | POA: Diagnosis not present

## 2021-06-27 LAB — HM DIABETES EYE EXAM

## 2021-07-25 DIAGNOSIS — H2512 Age-related nuclear cataract, left eye: Secondary | ICD-10-CM | POA: Diagnosis not present

## 2021-08-08 ENCOUNTER — Encounter: Payer: Self-pay | Admitting: Ophthalmology

## 2021-08-09 ENCOUNTER — Ambulatory Visit: Payer: Medicare Other | Admitting: Anesthesiology

## 2021-08-09 ENCOUNTER — Encounter: Payer: Self-pay | Admitting: Ophthalmology

## 2021-08-09 ENCOUNTER — Other Ambulatory Visit: Payer: Self-pay

## 2021-08-09 ENCOUNTER — Ambulatory Visit
Admission: RE | Admit: 2021-08-09 | Discharge: 2021-08-09 | Disposition: A | Discharge status: Home / Self Care | Payer: Medicare Other | Attending: Ophthalmology | Admitting: Ophthalmology

## 2021-08-09 ENCOUNTER — Encounter
Admission: RE | Disposition: A | Discharge status: Home / Self Care | Payer: Self-pay | Source: Home / Self Care | Attending: Ophthalmology

## 2021-08-09 DIAGNOSIS — H2512 Age-related nuclear cataract, left eye: Secondary | ICD-10-CM | POA: Diagnosis not present

## 2021-08-09 DIAGNOSIS — Z87891 Personal history of nicotine dependence: Secondary | ICD-10-CM | POA: Diagnosis not present

## 2021-08-09 DIAGNOSIS — Z7985 Long-term (current) use of injectable non-insulin antidiabetic drugs: Secondary | ICD-10-CM | POA: Diagnosis not present

## 2021-08-09 DIAGNOSIS — E1136 Type 2 diabetes mellitus with diabetic cataract: Secondary | ICD-10-CM | POA: Diagnosis not present

## 2021-08-09 DIAGNOSIS — H25812 Combined forms of age-related cataract, left eye: Secondary | ICD-10-CM | POA: Diagnosis not present

## 2021-08-09 DIAGNOSIS — Z794 Long term (current) use of insulin: Secondary | ICD-10-CM | POA: Diagnosis not present

## 2021-08-09 DIAGNOSIS — E1122 Type 2 diabetes mellitus with diabetic chronic kidney disease: Secondary | ICD-10-CM | POA: Diagnosis not present

## 2021-08-09 DIAGNOSIS — N189 Chronic kidney disease, unspecified: Secondary | ICD-10-CM | POA: Insufficient documentation

## 2021-08-09 DIAGNOSIS — I129 Hypertensive chronic kidney disease with stage 1 through stage 4 chronic kidney disease, or unspecified chronic kidney disease: Secondary | ICD-10-CM | POA: Insufficient documentation

## 2021-08-09 HISTORY — PX: CATARACT EXTRACTION W/PHACO: SHX586

## 2021-08-09 HISTORY — DX: Presence of dental prosthetic device (complete) (partial): Z97.2

## 2021-08-09 LAB — GLUCOSE, CAPILLARY
Glucose-Capillary: 154 mg/dL — ABNORMAL HIGH (ref 70–99)
Glucose-Capillary: 138 mg/dL — ABNORMAL HIGH (ref 70–99)

## 2021-08-09 SURGERY — PHACOEMULSIFICATION, CATARACT, WITH IOL INSERTION
Anesthesia: Monitor Anesthesia Care | Site: Eye | Laterality: Left

## 2021-08-09 MED ORDER — ACETAMINOPHEN 160 MG/5ML PO SOLN: 975.0000 mg | Freq: Once | ORAL | Status: DC | PRN

## 2021-08-09 MED ORDER — LACTATED RINGERS IV SOLN: INTRAVENOUS | Status: DC

## 2021-08-09 MED ORDER — SIGHTPATH DOSE#1 BSS IO SOLN
INTRAOCULAR | Status: DC | PRN
  Administered 2021-08-09: 82 mL via OPHTHALMIC

## 2021-08-09 MED ORDER — ONDANSETRON HCL 4 MG/2ML IJ SOLN: 4.0000 mg | Freq: Once | INTRAMUSCULAR | Status: DC | PRN

## 2021-08-09 MED ORDER — FENTANYL CITRATE (PF) 100 MCG/2ML IJ SOLN
INTRAMUSCULAR | Status: DC | PRN
  Administered 2021-08-09: 50 ug via INTRAVENOUS

## 2021-08-09 MED ORDER — MIDAZOLAM HCL 2 MG/2ML IJ SOLN
INTRAMUSCULAR | Status: DC | PRN
  Administered 2021-08-09: 1 mg via INTRAVENOUS

## 2021-08-09 MED ORDER — ACETAMINOPHEN 500 MG PO TABS: 1000.0000 mg | ORAL_TABLET | Freq: Once | ORAL | Status: DC | PRN

## 2021-08-09 MED ORDER — SIGHTPATH DOSE#1 BSS IO SOLN
INTRAOCULAR | Status: DC | PRN
  Administered 2021-08-09: 15 mL

## 2021-08-09 MED ORDER — SIGHTPATH DOSE#1 NA HYALUR & NA CHOND-NA HYALUR IO KIT
PACK | INTRAOCULAR | Status: DC | PRN
  Administered 2021-08-09: 1 via OPHTHALMIC

## 2021-08-09 MED ORDER — CEFUROXIME OPHTHALMIC INJECTION 1 MG/0.1 ML
INJECTION | OPHTHALMIC | Status: DC | PRN
  Administered 2021-08-09: 0.1 mL via INTRACAMERAL

## 2021-08-09 MED ORDER — BRIMONIDINE TARTRATE-TIMOLOL 0.2-0.5 % OP SOLN
OPHTHALMIC | Status: DC | PRN
  Administered 2021-08-09: 1 [drp] via OPHTHALMIC

## 2021-08-09 MED ORDER — TETRACAINE HCL 0.5 % OP SOLN
1.0000 [drp] | OPHTHALMIC | Status: DC | PRN
  Administered 2021-08-09 (×3): 1 [drp] via OPHTHALMIC

## 2021-08-09 MED ORDER — SIGHTPATH DOSE#1 BSS IO SOLN: INTRAOCULAR | Status: DC | PRN

## 2021-08-09 MED ORDER — ARMC OPHTHALMIC DILATING DROPS
1.0000 "application " | OPHTHALMIC | Status: DC | PRN
  Administered 2021-08-09 (×3): 1 via OPHTHALMIC

## 2021-08-09 SURGICAL SUPPLY — 11 items
CATARACT SUITE SIGHTPATH (MISCELLANEOUS) ×2 IMPLANT
FEE CATARACT SUITE SIGHTPATH (MISCELLANEOUS) ×1 IMPLANT
GLOVE SRG 8 PF TXTR STRL LF DI (GLOVE) ×1 IMPLANT
GLOVE SURG ENC TEXT LTX SZ7.5 (GLOVE) ×2 IMPLANT
GLOVE SURG UNDER POLY LF SZ8 (GLOVE) ×2
LENS IOL TECNIS EYHANCE 20.0 (Intraocular Lens) ×1 IMPLANT
NDL FILTER BLUNT 18X1 1/2 (NEEDLE) ×1 IMPLANT
NEEDLE FILTER BLUNT 18X 1/2SAF (NEEDLE) ×1
NEEDLE FILTER BLUNT 18X1 1/2 (NEEDLE) ×1 IMPLANT
SYR 3ML LL SCALE MARK (SYRINGE) ×2 IMPLANT
WATER STERILE IRR 250ML POUR (IV SOLUTION) ×2 IMPLANT

## 2021-08-09 NOTE — H&P (Signed)
[facility]   Primary Care Physician:  Jacky Kindle, FNP Ophthalmologist: Dr. Lockie Mola  Pre-Procedure History & Physical: HPI:  Julie [name] is a 71 y.o. female here for ophthalmic surgery.   Past Medical History:  Diagnosis Date   Anemia    Arthritis    Chronic kidney disease    Diabetes mellitus without complication ([facility])    Diabetic acidosis ([facility])    Hepatitis    hepatitis c, treated   History of peptic ulcer disease    Hypertension    Microalbuminuria    Wears dentures    full upper and lower    Past Surgical History:  Procedure Laterality Date   ABDOMINAL HYSTERECTOMY  1985   COLONOSCOPY WITH PROPOFOL N/A 07/31/2016   Procedure: COLONOSCOPY WITH PROPOFOL;  Surgeon: Christena Deem, MD;  Location: [facility] ENDOSCOPY;  Service: Endoscopy;  Laterality: N/A;   COLONOSCOPY WITH PROPOFOL N/A 05/26/2021   Procedure: COLONOSCOPY WITH PROPOFOL;  Surgeon: Wyline Mood, MD;  Location: [facility] ENDOSCOPY;  Service: Gastroenterology;  Laterality: N/A;  Cell [phone])   DG  BONE DENSITY ([facility] HX)     EXCISION MASS NECK Left 12/29/2016   Procedure: CHEST WALL MASS EXCISION;  Surgeon: Earline Mayotte, MD;  Location: [facility] ORS;  Service: General;  Laterality: Left;   LAPAROSCOPIC HYSTERECTOMY     assisted vaginal hysterectomy, fibroids, ovaries removed   LIVER BIOPSY  2009   + hepatitis C, referred to [facility] by Marva Panda 04/2008.   NEPHROSTOMY W/ INTRODUCTION OF CATHETER     TUBAL LIGATION      Prior to Admission medications   Medication Sig Start Date End Date Taking? Authorizing Provider  atorvastatin (LIPITOR) 10 MG tablet Take 1 tablet by mouth once daily 08/23/20  Yes Bacigalupo, Marzella Schlein, MD  Cholecalciferol 25 MCG (1000 UT) tablet Take 5,000 Units by mouth daily.   Yes [provider]  Cinnamon 500 MG TABS Take 1,000 mg by mouth daily.   Yes [provider]  enalapril (VASOTEC) 20 MG tablet Take 40 mg by mouth daily.  07/15/13  Yes  [provider]  hydrochlorothiazide (HYDRODIURIL) 25 MG tablet Take by mouth. 08/19/18 08/09/21 Yes [provider]  insulin aspart (NOVOLOG) 100 UNIT/ML FlexPen Inject into the skin 3 (three) times daily with meals. Takes 26 units @ breakfast, 28 units before lunch and 32 units before dinner. 08/14/17  Yes [provider]  insulin detemir (LEVEMIR) 100 UNIT/ML FlexPen Inject 52 Units into the skin at bedtime.    Yes [provider]  naproxen sodium (ALEVE) 220 MG tablet Take 220 mg by mouth daily as needed.   Yes [provider]  Omega-3 1000 MG CAPS Take by mouth daily.   Yes [provider]  Probiotic Product (PROBIOTIC DAILY PO) Take by mouth daily.   Yes [provider]  Semaglutide,0.25 or 0.5MG /DOS, 2 MG/1.5ML SOPN Inject 0.5 mg into the skin. 01/31/21  Yes [provider]  Continuous Blood Gluc Sensor (FREESTYLE LIBRE 14 DAY SENSOR) MISC Use 1 kit every 14 (fourteen) days 08/14/17   [provider]  Continuous Blood Gluc Sensor (FREESTYLE LIBRE 2 SENSOR) MISC Use 1 kit every 14 (fourteen) days for glucose monitoring 10/25/20   [provider]  insulin detemir (LEVEMIR FLEXTOUCH) 100 UNIT/ML FlexPen Inject into the skin. 10/25/20   [provider]  meloxicam (MOBIC) 15 MG tablet Take 1 tablet (15 mg total) by mouth daily. Patient not taking: Reported on 05/24/2021 02/23/21  Caro Laroche, DO    Allergies as of 06/27/2021   (No Known Allergies)    Family History  Problem Relation Age of Onset   Arthritis Mother    Hypertension Mother    Stroke Father    Alcohol abuse Father    Cirrhosis Father    Cancer Maternal Aunt    Diabetes Son    Pancreatic disease Brother    Healthy Brother    Healthy Brother    Breast cancer Neg Hx     Social History   Socioeconomic History   Marital status: Married    Spouse name: Not on file   Number of children: 4   Years of education: Not on file    Highest education level: Associate degree: occupational, Scientist, product/process development, or vocational program  Occupational History   Occupation: Driving a school bus    Comment: full time (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)  Tobacco Use   Smoking status: Former    Packs/day: 0.75    Years: 36.00    Total pack years: 27.00    Types: Cigarettes    Quit date: 02/13/2005    Years since quitting: 16.4   Smokeless tobacco: Never  Vaping Use   Vaping Use: Never used  Substance and Sexual Activity   Alcohol use: No   Drug use: No   Sexual activity: Yes  Other Topics Concern   Not on file  Social History Narrative   Not on file   Social Determinants of Health   Financial Resource Strain: Low Risk  (05/05/2021)   Overall Financial Resource Strain (CARDIA)    Difficulty of Paying Living Expenses: Not hard at all  Food Insecurity: No Food Insecurity (05/05/2021)   Hunger Vital Sign    Worried About Running Out of Food in the Last Year: Never true    Ran Out of Food in the Last Year: Never true  Transportation Needs: No Transportation Needs (05/05/2021)   PRAPARE - Administrator, Civil Service (Medical): No    Lack of Transportation (Non-Medical): No  Physical Activity: Insufficiently Active (05/05/2021)   Exercise Vital Sign    Days of Exercise per Week: 2 days    Minutes of Exercise per Session: 20 min  Stress: No Stress Concern Present (05/05/2021)   [employer] of Occupational Health - Occupational Stress Questionnaire    Feeling of Stress : Not at all  Social Connections: Moderately Integrated (05/05/2021)   Social Connection and Isolation Panel [NHANES]    Frequency of Communication with Friends and Family: Three times a week    Frequency of Social Gatherings with Friends and Family: Twice a week    Attends Religious Services: More than 4 times per year    Active Member of [employer] or Organizations: No    Attends Banker Meetings: Never    Marital Status: Married  Catering manager  Violence: Not At Risk (05/05/2021)   Humiliation, Afraid, Rape, and Kick questionnaire    Fear of Current or Ex-Partner: No    Emotionally Abused: No    Physically Abused: No    Sexually Abused: No    Review of Systems: See HPI, otherwise negative ROS  Physical Exam: BP (!) 159/86   Pulse 74   Temp (!) 97.3 F (36.3 C) (Temporal)   Resp 16   Ht 5\' 5"  (1.651 m)   Wt 94.3 kg   SpO2 96%   BMI 34.58 kg/m  General:   [name],  pleasant and  cooperative in NAD Head:  Normocephalic and atraumatic. Lungs:  Clear to auscultation.    Heart:  Regular rate and rhythm.   Impression/Plan: Julie [name] is here for ophthalmic surgery.  Risks, benefits, limitations, and alternatives regarding ophthalmic surgery have been reviewed with the patient.  Questions have been answered.  All parties agreeable.   Lockie Mola, MD  08/09/2021, 8:20 AM

## 2021-08-09 NOTE — Transfer of Care (Signed)
Immediate Anesthesia Transfer of Care Note  Patient: Julie [name]  Procedure(s) Performed: CATARACT EXTRACTION PHACO AND INTRAOCULAR LENS PLACEMENT (IOC) LEFT DIABETIC 6.88 01:11.9 (Left: Eye)  Patient Location: PACU  Anesthesia Type: MAC  Level of Consciousness: awake, [name]  and patient cooperative  Airway and Oxygen Therapy: Patient Spontanous Breathing and Patient connected to supplemental oxygen  Post-op Assessment: Post-op Vital signs reviewed, Patient's Cardiovascular Status Stable, Respiratory Function Stable, Patent Airway and No signs of Nausea or vomiting  Post-op Vital Signs: Reviewed and stable  Complications: No notable events documented.

## 2021-08-09 NOTE — Anesthesia Preprocedure Evaluation (Addendum)
Anesthesia Evaluation  Patient identified by MRN, date of birth, ID band Patient awake    Reviewed: Allergy & Precautions, H&P , NPO status , Patient's Chart, lab work & pertinent test results, reviewed documented beta blocker date and time   Airway Mallampati: III  TM Distance: >3 FB Neck ROM: full    Dental no notable dental hx.    Pulmonary neg pulmonary ROS, former smoker,    Pulmonary exam normal breath sounds clear to auscultation       Cardiovascular Exercise Tolerance: Good hypertension, negative cardio ROS   Rhythm:regular Rate:Normal     Neuro/Psych negative neurological ROS  negative psych ROS   GI/Hepatic negative GI ROS, Neg liver ROS, (+) Hepatitis - (treated), C  Endo/Other  negative endocrine ROSdiabetes  Renal/GU Renal diseasenegative Renal ROS  negative genitourinary   Musculoskeletal   Abdominal   Peds  Hematology negative hematology ROS (+)   Anesthesia Other Findings   Reproductive/Obstetrics negative OB ROS                            Anesthesia Physical Anesthesia Plan  ASA: 2  Anesthesia Plan: MAC   Post-op Pain Management:    Induction:   PONV Risk Score and Plan: 2 and TIVA, Midazolam and Treatment may vary due to age or medical condition  Airway Management Planned:   Additional Equipment:   Intra-op Plan:   Post-operative Plan:   Informed Consent: I have reviewed the patients History and Physical, chart, labs and discussed the procedure including the risks, benefits and alternatives for the proposed anesthesia with the patient or authorized representative who has indicated his/her understanding and acceptance.     Dental Advisory Given  Plan Discussed with: CRNA  Anesthesia Plan Comments:         Anesthesia Quick Evaluation

## 2021-08-09 NOTE — Anesthesia Postprocedure Evaluation (Signed)
Anesthesia Post Note  Patient: Julie [name]  Procedure(s) Performed: CATARACT EXTRACTION PHACO AND INTRAOCULAR LENS PLACEMENT (IOC) LEFT DIABETIC 6.88 01:11.9 (Left: Eye)     Patient location during evaluation: PACU Anesthesia Type: MAC Level of consciousness: awake and [name] Pain management: pain level controlled Vital Signs Assessment: post-procedure vital signs reviewed and stable Respiratory status: spontaneous breathing, nonlabored ventilation and respiratory function stable Cardiovascular status: blood pressure returned to baseline and stable Postop Assessment: no apparent nausea or vomiting Anesthetic complications: no   No notable events documented.  Loni Beckwith

## 2021-08-10 ENCOUNTER — Encounter: Payer: Self-pay | Admitting: Ophthalmology

## 2021-08-10 ENCOUNTER — Other Ambulatory Visit: Payer: Self-pay

## 2021-08-29 NOTE — Discharge Instructions (Signed)

## 2021-08-30 MED ORDER — BUPIVACAINE LIPOSOME 1.3 % IJ SUSP
INTRAMUSCULAR | Status: AC
  Filled 2021-08-30: qty 20

## 2021-08-30 MED ORDER — SODIUM CHLORIDE (PF) 0.9 % IJ SOLN
INTRAMUSCULAR | Status: AC
  Filled 2021-08-30: qty 50

## 2021-08-30 MED ORDER — BUPIVACAINE-EPINEPHRINE (PF) 0.5% -1:200000 IJ SOLN
INTRAMUSCULAR | Status: AC
  Filled 2021-08-30: qty 30

## 2021-08-31 ENCOUNTER — Encounter
Admission: RE | Disposition: A | Discharge status: Home / Self Care | Payer: Self-pay | Source: Home / Self Care | Attending: Ophthalmology

## 2021-08-31 ENCOUNTER — Ambulatory Visit
Admission: RE | Admit: 2021-08-31 | Discharge: 2021-08-31 | Disposition: A | Discharge status: Home / Self Care | Payer: Medicare Other | Attending: Ophthalmology | Admitting: Ophthalmology

## 2021-08-31 ENCOUNTER — Ambulatory Visit: Payer: Medicare Other | Admitting: Anesthesiology

## 2021-08-31 ENCOUNTER — Encounter: Payer: Self-pay | Admitting: Ophthalmology

## 2021-08-31 ENCOUNTER — Other Ambulatory Visit: Payer: Self-pay

## 2021-08-31 DIAGNOSIS — N189 Chronic kidney disease, unspecified: Secondary | ICD-10-CM | POA: Diagnosis not present

## 2021-08-31 DIAGNOSIS — I129 Hypertensive chronic kidney disease with stage 1 through stage 4 chronic kidney disease, or unspecified chronic kidney disease: Secondary | ICD-10-CM | POA: Insufficient documentation

## 2021-08-31 DIAGNOSIS — N289 Disorder of kidney and ureter, unspecified: Secondary | ICD-10-CM | POA: Diagnosis not present

## 2021-08-31 DIAGNOSIS — Z794 Long term (current) use of insulin: Secondary | ICD-10-CM | POA: Insufficient documentation

## 2021-08-31 DIAGNOSIS — Z87891 Personal history of nicotine dependence: Secondary | ICD-10-CM | POA: Insufficient documentation

## 2021-08-31 DIAGNOSIS — E1136 Type 2 diabetes mellitus with diabetic cataract: Secondary | ICD-10-CM | POA: Diagnosis not present

## 2021-08-31 DIAGNOSIS — H2511 Age-related nuclear cataract, right eye: Secondary | ICD-10-CM | POA: Insufficient documentation

## 2021-08-31 DIAGNOSIS — B1920 Unspecified viral hepatitis C without hepatic coma: Secondary | ICD-10-CM | POA: Diagnosis not present

## 2021-08-31 DIAGNOSIS — Z8619 Personal history of other infectious and parasitic diseases: Secondary | ICD-10-CM | POA: Diagnosis not present

## 2021-08-31 DIAGNOSIS — E1122 Type 2 diabetes mellitus with diabetic chronic kidney disease: Secondary | ICD-10-CM | POA: Diagnosis not present

## 2021-08-31 DIAGNOSIS — E119 Type 2 diabetes mellitus without complications: Secondary | ICD-10-CM | POA: Diagnosis not present

## 2021-08-31 DIAGNOSIS — E1169 Type 2 diabetes mellitus with other specified complication: Secondary | ICD-10-CM

## 2021-08-31 DIAGNOSIS — I10 Essential (primary) hypertension: Secondary | ICD-10-CM | POA: Diagnosis not present

## 2021-08-31 DIAGNOSIS — M1990 Unspecified osteoarthritis, unspecified site: Secondary | ICD-10-CM | POA: Diagnosis not present

## 2021-08-31 HISTORY — PX: CATARACT EXTRACTION W/PHACO: SHX586

## 2021-08-31 LAB — GLUCOSE, CAPILLARY
Glucose-Capillary: 171 mg/dL — ABNORMAL HIGH (ref 70–99)
Glucose-Capillary: 133 mg/dL — ABNORMAL HIGH (ref 70–99)

## 2021-08-31 SURGERY — PHACOEMULSIFICATION, CATARACT, WITH IOL INSERTION
Anesthesia: Monitor Anesthesia Care | Site: Eye | Laterality: Right

## 2021-08-31 MED ORDER — SIGHTPATH DOSE#1 NA HYALUR & NA CHOND-NA HYALUR IO KIT
PACK | INTRAOCULAR | Status: DC | PRN
  Administered 2021-08-31: 1 via OPHTHALMIC

## 2021-08-31 MED ORDER — TETRACAINE HCL 0.5 % OP SOLN
1.0000 [drp] | OPHTHALMIC | Status: DC | PRN
  Administered 2021-08-31 (×3): 1 [drp] via OPHTHALMIC

## 2021-08-31 MED ORDER — FENTANYL CITRATE (PF) 100 MCG/2ML IJ SOLN
INTRAMUSCULAR | Status: DC | PRN
  Administered 2021-08-31: 50 ug via INTRAVENOUS

## 2021-08-31 MED ORDER — SIGHTPATH DOSE#1 BSS IO SOLN
INTRAOCULAR | Status: DC | PRN
  Administered 2021-08-31: 1 mL via INTRAMUSCULAR

## 2021-08-31 MED ORDER — SIGHTPATH DOSE#1 BSS IO SOLN
INTRAOCULAR | Status: DC | PRN
  Administered 2021-08-31: 61 mL via OPHTHALMIC

## 2021-08-31 MED ORDER — CEFUROXIME OPHTHALMIC INJECTION 1 MG/0.1 ML
INJECTION | OPHTHALMIC | Status: DC | PRN
  Administered 2021-08-31: 0.1 mL via INTRACAMERAL

## 2021-08-31 MED ORDER — SIGHTPATH DOSE#1 BSS IO SOLN
INTRAOCULAR | Status: DC | PRN
  Administered 2021-08-31: 15 mL

## 2021-08-31 MED ORDER — ARMC OPHTHALMIC DILATING DROPS
1.0000 "application " | OPHTHALMIC | Status: DC | PRN
  Administered 2021-08-31 (×3): 1 via OPHTHALMIC

## 2021-08-31 MED ORDER — MIDAZOLAM HCL 2 MG/2ML IJ SOLN
INTRAMUSCULAR | Status: DC | PRN
  Administered 2021-08-31: 2 mg via INTRAVENOUS

## 2021-08-31 MED ORDER — BRIMONIDINE TARTRATE-TIMOLOL 0.2-0.5 % OP SOLN
OPHTHALMIC | Status: DC | PRN
  Administered 2021-08-31: 1 [drp] via OPHTHALMIC

## 2021-08-31 SURGICAL SUPPLY — 11 items
CATARACT SUITE SIGHTPATH (MISCELLANEOUS) ×2 IMPLANT
FEE CATARACT SUITE SIGHTPATH (MISCELLANEOUS) ×1 IMPLANT
GLOVE SRG 8 PF TXTR STRL LF DI (GLOVE) ×1 IMPLANT
GLOVE SURG ENC TEXT LTX SZ7.5 (GLOVE) ×2 IMPLANT
GLOVE SURG UNDER POLY LF SZ8 (GLOVE) ×2
LENS IOL TECNIS EYHANCE 20.0 (Intraocular Lens) ×1 IMPLANT
NDL FILTER BLUNT 18X1 1/2 (NEEDLE) ×1 IMPLANT
NEEDLE FILTER BLUNT 18X 1/2SAF (NEEDLE) ×1
NEEDLE FILTER BLUNT 18X1 1/2 (NEEDLE) ×1 IMPLANT
SYR 3ML LL SCALE MARK (SYRINGE) ×2 IMPLANT
WATER STERILE IRR 250ML POUR (IV SOLUTION) ×2 IMPLANT

## 2021-08-31 NOTE — H&P (Signed)
[facility]   Primary Care Physician:  Gwyneth Sprout, FNP Ophthalmologist: Dr. Leandrew Koyanagi  Pre-Procedure History & Physical: HPI:  Julie [name] is a 71 y.o. female here for ophthalmic surgery.   Past Medical History:  Diagnosis Date   Anemia    Arthritis    Chronic kidney disease    Diabetes mellitus without complication (HCC)    Diabetic acidosis (HCC)    Hepatitis    hepatitis c, treated   History of peptic ulcer disease    Hypertension    Microalbuminuria    Wears dentures    full upper and lower    Past Surgical History:  Procedure Laterality Date   ABDOMINAL HYSTERECTOMY  1985   CATARACT EXTRACTION W/PHACO Left 08/09/2021   Procedure: CATARACT EXTRACTION PHACO AND INTRAOCULAR LENS PLACEMENT ([place]) LEFT DIABETIC 6.88 01:11.9;  Surgeon: Leandrew Koyanagi, MD;  Location: [place];  Service: Ophthalmology;  Laterality: Left;  Diabetic   COLONOSCOPY WITH PROPOFOL N/A 07/31/2016   Procedure: COLONOSCOPY WITH PROPOFOL;  Surgeon: Lollie Sails, MD;  Location: [facility] END[facility];  Service: Endoscopy;  Laterality: N/A;   COLONOSCOPY WITH PROPOFOL N/A 05/26/2021   Procedure: COLONOSCOPY WITH PROPOFOL;  Surgeon: Jonathon Bellows, MD;  Location: [facility] ENDOSCOPY;  Service: Gastroenterology;  Laterality: N/A;  Cell [phone])   DG  BONE DENSITY ([place] HX)     EXCISION MASS NECK Left 12/29/2016   Procedure: CHEST WALL MASS EXCISION;  Surgeon: Robert Bellow, MD;  Location: [facility] ORS;  Service: General;  Laterality: Left;   LAPAROSCOPIC HYSTERECTOMY     assisted vaginal hysterectomy, fibroids, ovaries removed   LIVER BIOPSY  2009   + hepatitis C, referred to [facility] by Gustavo Lah 04/2008.   NEPHROSTOMY W/ INTRODUCTION OF CATHETER     TUBAL LIGATION      Prior to Admission medications   Medication Sig Start Date End Date Taking? Authorizing Provider  atorvastatin (LIPITOR) 10 MG tablet Take 1 tablet by mouth once daily 08/23/20  Yes Bacigalupo, Dionne Bucy,  MD  Cholecalciferol 25 MCG (1000 UT) tablet Take 5,000 Units by mouth daily.   Yes [provider]  Cinnamon 500 MG TABS Take 1,000 mg by mouth daily.   Yes [provider]  enalapril (VASOTEC) 20 MG tablet Take 40 mg by mouth daily.  07/15/13  Yes [provider]  hydrochlorothiazide (HYDRODIURIL) 25 MG tablet Take by mouth. 08/19/18 08/31/21 Yes [provider]  insulin aspart (NOVOLOG) 100 UNIT/ML FlexPen Inject into the skin 3 (three) times daily with meals. Takes 26 units @ breakfast, 28 units before lunch and 32 units before dinner. 08/14/17  Yes [provider]  insulin detemir (LEVEMIR FLEXTOUCH) 100 UNIT/ML FlexPen Inject into the skin. 10/25/20  Yes [provider]  insulin detemir (LEVEMIR) 100 UNIT/ML FlexPen Inject 52 Units into the skin at bedtime.    Yes [provider]  naproxen sodium (ALEVE) 220 MG tablet Take 220 mg by mouth daily as needed.   Yes [provider]  Omega-3 1000 MG CAPS Take by mouth daily.   Yes [provider]  Probiotic Product (PROBIOTIC DAILY PO) Take by mouth daily.   Yes [provider]  Semaglutide,0.25 or 0.5MG/DOS, 2 MG/1.5ML SOPN Inject 0.5 mg into the skin. 01/31/21  Yes [provider]  Continuous Blood Gluc Sensor (FREESTYLE LIBRE 14 DAY SENSOR) MISC Use 1 kit every 14 (fourteen) days 08/14/17   [provider]  Continuous Blood Gluc Sensor (FREESTYLE LIBRE 2  SENSOR) MISC Use 1 kit every 14 (fourteen) days for glucose monitoring 10/25/20   [provider]  meloxicam (MOBIC) 15 MG tablet Take 1 tablet (15 mg total) by mouth daily. Patient not taking: Reported on 05/24/2021 02/23/21   Myles Gip, DO    Allergies as of 06/27/2021   (No Known Allergies)    Family History  Problem Relation Age of Onset   Arthritis Mother    Hypertension Mother    Stroke Father    Alcohol abuse Father    Cirrhosis Father    Cancer Maternal Aunt     Diabetes Son    Pancreatic disease Brother    Healthy Brother    Healthy Brother    Breast cancer Neg Hx     Social History   Socioeconomic History   Marital status: Married    Spouse name: Not on file   Number of children: 4   Years of education: Not on file   Highest education level: Associate degree: occupational, Hotel manager, or vocational program  Occupational History   Occupation: Driving a school bus    Comment: full time (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)  Tobacco Use   Smoking status: Former    Packs/day: 0.75    Years: 36.00    Total pack years: 27.00    Types: Cigarettes    Quit date: 02/13/2005    Years since quitting: 16.5   Smokeless tobacco: Never  Vaping Use   Vaping Use: Never used  Substance and Sexual Activity   Alcohol use: No   Drug use: No   Sexual activity: Yes  Other Topics Concern   Not on file  Social History Narrative   Not on file   Social Determinants of Health   Financial Resource Strain: Low Risk  (05/05/2021)   Overall Financial Resource Strain (CARDIA)    Difficulty of Paying Living Expenses: Not hard at all  Food Insecurity: No Food Insecurity (05/05/2021)   Hunger Vital Sign    Worried About Running Out of Food in the Last Year: Never true    [place] in the Last Year: Never true  Transportation Needs: No Transportation Needs (05/05/2021)   PRAPARE - Hydrologist (Medical): No    Lack of Transportation (Non-Medical): No  Physical Activity: Insufficiently Active (05/05/2021)   Exercise Vital Sign    Days of Exercise per Week: 2 days    Minutes of Exercise per Session: 20 min  Stress: No Stress Concern Present (05/05/2021)   [place]    Feeling of Stress : Not at all  Social Connections: Moderately Integrated (05/05/2021)   Social Connection and Isolation Panel [NHANES]    Frequency of Communication with Friends and Family: Three times a week     Frequency of Social Gatherings with Friends and Family: Twice a week    Attends Religious Services: More than 4 times per year    Active Member of [employer] or Organizations: No    Attends Archivist Meetings: Never    Marital Status: Married  Human resources officer Violence: Not At Risk (05/05/2021)   Humiliation, Afraid, Rape, and Kick questionnaire    Fear of Current or Ex-Partner: No    Emotionally Abused: No    Physically Abused: No    Sexually Abused: No    Review of Systems: See HPI, otherwise negative ROS  Physical Exam: BP (!) 152/92   Pulse 75  Temp 97.7 F (36.5 C) (Temporal)   Resp 16   Ht _0  (1.651 m)   Wt 93.9 kg   SpO2 98%   BMI 34.45 kg/m  General:   Alert,  pleasant and cooperative in NAD Head:  Normocephalic and atraumatic. Lungs:  Clear to auscultation.    Heart:  Regular rate and rhythm.   Impression/Plan: [name] is here for ophthalmic surgery.  Risks, benefits, limitations, and alternatives regarding ophthalmic surgery have been reviewed with the patient.  Questions have been answered.  All parties agreeable.   Leandrew Koyanagi, MD  08/31/2021, 9:54 AM

## 2021-08-31 NOTE — Transfer of Care (Signed)
Immediate Anesthesia Transfer of Care Note  Patient: Julie [name]  Procedure(s) Performed: CATARACT EXTRACTION PHACO AND INTRAOCULAR LENS PLACEMENT (IOC) RIGHT DIABETIC 5.16 00:50.8 (Right: Eye)  Patient Location: PACU  Anesthesia Type: MAC  Level of Consciousness: awake, alert  and patient cooperative  Airway and Oxygen Therapy: Patient Spontanous Breathing and Patient connected to supplemental oxygen  Post-op Assessment: Post-op Vital signs reviewed, Patient's Cardiovascular Status Stable, Respiratory Function Stable, Patent Airway and No signs of Nausea or vomiting  Post-op Vital Signs: Reviewed and stable  Complications: No notable events documented.

## 2021-08-31 NOTE — Op Note (Signed)
LOCATION:  [place]   PREOPERATIVE DIAGNOSIS:    Nuclear sclerotic cataract right eye. H25.11   POSTOPERATIVE DIAGNOSIS:  Nuclear sclerotic cataract right eye.     PROCEDURE:  Phacoemusification with posterior chamber intraocular lens placement of the right eye   ULTRASOUND TIME: Procedure(s) with comments: CATARACT EXTRACTION PHACO AND INTRAOCULAR LENS PLACEMENT (IOC) RIGHT DIABETIC 5.16 00:50.8 (Right) - Diabetic  LENS:   Implant Name Type Inv. Item Serial No. Manufacturer Lot No. LRB No. Used Action  LENS IOL TECNIS EYHANCE 20.0 - [id number] Intraocular Lens LENS IOL TECNIS EYHANCE 20.0 [id number] [employer]  Right 1 Implanted         SURGEON:  Wyonia Hough, MD   ANESTHESIA:  Topical with tetracaine drops and 2% Xylocaine jelly, augmented with 1% preservative-free intracameral lidocaine.    COMPLICATIONS:  None.   DESCRIPTION OF PROCEDURE:  The patient was identified in the holding room and transported to the operating room and placed in the supine position under the operating microscope.  The right eye was identified as the operative eye and it was prepped and draped in the usual sterile ophthalmic fashion.   A 1 millimeter clear-corneal paracentesis was made at the 12:00 position.  0.5 ml of preservative-free 1% lidocaine was injected into the anterior chamber. The anterior chamber was filled with Viscoat viscoelastic.  A 2.4 millimeter keratome was used to make a near-clear corneal incision at the 9:00 position.  A curvilinear capsulorrhexis was made with a cystotome and capsulorrhexis forceps.  Balanced salt solution was used to hydrodissect and hydrodelineate the nucleus.   Phacoemulsification was then used in stop and chop fashion to remove the lens nucleus and epinucleus.  The remaining cortex was then removed using the irrigation and aspiration handpiece. Provisc was then placed into the capsular bag to distend it for lens placement.  A lens was then  injected into the capsular bag.  The remaining viscoelastic was aspirated.   Wounds were hydrated with balanced salt solution.  The anterior chamber was inflated to a physiologic pressure with balanced salt solution.  No wound leaks were noted. Cefuroxime 0.1 ml of a '10mg'$ /ml solution was injected into the anterior chamber for a dose of 1 mg of intracameral antibiotic at the completion of the case.   Timolol and Brimonidine drops were applied to the eye.  The patient was taken to the recovery room in stable condition without complications of anesthesia or surgery.   Hiroki Wint 08/31/2021, 10:41 AM

## 2021-08-31 NOTE — Anesthesia Preprocedure Evaluation (Signed)
Anesthesia Evaluation  Patient identified by MRN, date of birth, ID band Patient awake    Reviewed: Allergy & Precautions, H&P , NPO status , Patient's Chart, lab work & pertinent test results, reviewed documented beta blocker date and time   Airway Mallampati: III  TM Distance: >3 FB Neck ROM: full    Dental no notable dental hx.    Pulmonary former smoker (27 pack years - quit 2007),    Pulmonary exam normal        Cardiovascular Exercise Tolerance: Good hypertension,  Rhythm:regular Rate:Normal     Neuro/Psych negative neurological ROS  negative psych ROS   GI/Hepatic negative GI ROS, (+) Hepatitis - (treated), C  Endo/Other  negative endocrine ROSdiabetes  Renal/GU Renal diseasenegative Renal ROS  negative genitourinary   Musculoskeletal  (+) Arthritis ,   Abdominal Normal abdominal exam  (+)   Peds  Hematology negative hematology ROS (+)   Anesthesia Other Findings   Reproductive/Obstetrics negative OB ROS                             Anesthesia Physical  Anesthesia Plan  ASA: 2  Anesthesia Plan: MAC   Post-op Pain Management:    Induction: Intravenous  PONV Risk Score and Plan: 2 and TIVA, Midazolam and Treatment may vary due to age or medical condition  Airway Management Planned: Nasal Cannula and Natural Airway  Additional Equipment:   Intra-op Plan:   Post-operative Plan:   Informed Consent: I have reviewed the patients History and Physical, chart, labs and discussed the procedure including the risks, benefits and alternatives for the proposed anesthesia with the patient or authorized representative who has indicated his/her understanding and acceptance.     Dental Advisory Given  Plan Discussed with: CRNA  Anesthesia Plan Comments:         Anesthesia Quick Evaluation

## 2021-08-31 NOTE — Anesthesia Postprocedure Evaluation (Signed)
Anesthesia Post Note  Patient: Julie [name]  Procedure(s) Performed: CATARACT EXTRACTION PHACO AND INTRAOCULAR LENS PLACEMENT (IOC) RIGHT DIABETIC 5.16 00:50.8 (Right: Eye)     Patient location during evaluation: PACU Anesthesia Type: MAC Level of consciousness: awake and alert Pain management: pain level controlled Vital Signs Assessment: post-procedure vital signs reviewed and stable Respiratory status: spontaneous breathing and nonlabored ventilation Cardiovascular status: blood pressure returned to baseline Postop Assessment: no apparent nausea or vomiting Anesthetic complications: no   No notable events documented.  Dameer Speiser [employer]

## 2021-09-01 ENCOUNTER — Encounter: Payer: Self-pay | Admitting: Ophthalmology

## 2021-09-01 DIAGNOSIS — H2511 Age-related nuclear cataract, right eye: Secondary | ICD-10-CM | POA: Diagnosis not present

## 2021-09-26 NOTE — Progress Notes (Deleted)
      Established patient visit   Patient: Julie [name]   DOB: [birth year]   71 y.o. Female  MRN: [id number] Visit Date: 09/28/2021  Today's healthcare provider: Gwyneth Sprout, FNP   No chief complaint on file.  Subjective    HPI  ***  Medications: Outpatient Medications Prior to Visit  Medication Sig   atorvastatin (LIPITOR) 10 MG tablet Take 1 tablet by mouth once daily   Cholecalciferol 25 MCG (1000 UT) tablet Take 5,000 Units by mouth daily.   Cinnamon 500 MG TABS Take 1,000 mg by mouth daily.   Continuous Blood Gluc Sensor (FREESTYLE LIBRE 14 DAY SENSOR) MISC Use 1 kit every 14 (fourteen) days   Continuous Blood Gluc Sensor (FREESTYLE LIBRE 2 SENSOR) MISC Use 1 kit every 14 (fourteen) days for glucose monitoring   enalapril (VASOTEC) 20 MG tablet Take 40 mg by mouth daily.    hydrochlorothiazide (HYDRODIURIL) 25 MG tablet Take by mouth.   insulin aspart (NOVOLOG) 100 UNIT/ML FlexPen Inject into the skin 3 (three) times daily with meals. Takes 26 units @ breakfast, 28 units before lunch and 32 units before dinner.   insulin detemir (LEVEMIR FLEXTOUCH) 100 UNIT/ML FlexPen Inject into the skin.   insulin detemir (LEVEMIR) 100 UNIT/ML FlexPen Inject 52 Units into the skin at bedtime.    meloxicam (MOBIC) 15 MG tablet Take 1 tablet (15 mg total) by mouth daily. (Patient not taking: Reported on 05/24/2021)   naproxen sodium (ALEVE) 220 MG tablet Take 220 mg by mouth daily as needed.   Omega-3 1000 MG CAPS Take by mouth daily.   Probiotic Product (PROBIOTIC DAILY PO) Take by mouth daily.   Semaglutide,0.25 or 0.5MG /DOS, 2 MG/1.5ML SOPN Inject 0.5 mg into the skin.   No facility-administered medications prior to visit.    Review of Systems  {Labs  Heme  Chem  Endocrine  Serology  Results Review (optional):[id number]}   Objective    There were no vitals taken for this visit. {Show previous vital signs (optional):23777}  Physical Exam  ***  No results found for any  visits on 09/28/21.  Assessment & Plan     ***  No follow-ups on file.      {provider attestation***:1}   Gwyneth Sprout, [place] [phone] (phone) [phone] (fax)  [place]

## 2021-09-28 ENCOUNTER — Ambulatory Visit: Payer: Medicare Other | Admitting: Family Medicine

## 2021-11-22 DIAGNOSIS — Z794 Long term (current) use of insulin: Secondary | ICD-10-CM | POA: Diagnosis not present

## 2021-11-22 DIAGNOSIS — E1129 Type 2 diabetes mellitus with other diabetic kidney complication: Secondary | ICD-10-CM | POA: Diagnosis not present

## 2021-11-22 DIAGNOSIS — R809 Proteinuria, unspecified: Secondary | ICD-10-CM | POA: Diagnosis not present

## 2021-12-08 DIAGNOSIS — N1831 Chronic kidney disease, stage 3a: Secondary | ICD-10-CM | POA: Diagnosis not present

## 2021-12-08 DIAGNOSIS — E785 Hyperlipidemia, unspecified: Secondary | ICD-10-CM | POA: Diagnosis not present

## 2021-12-08 DIAGNOSIS — E1159 Type 2 diabetes mellitus with other circulatory complications: Secondary | ICD-10-CM | POA: Diagnosis not present

## 2021-12-08 DIAGNOSIS — I10 Essential (primary) hypertension: Secondary | ICD-10-CM | POA: Diagnosis not present

## 2021-12-08 DIAGNOSIS — Z794 Long term (current) use of insulin: Secondary | ICD-10-CM | POA: Diagnosis not present

## 2021-12-08 DIAGNOSIS — E1129 Type 2 diabetes mellitus with other diabetic kidney complication: Secondary | ICD-10-CM | POA: Diagnosis not present

## 2021-12-08 DIAGNOSIS — E1169 Type 2 diabetes mellitus with other specified complication: Secondary | ICD-10-CM | POA: Diagnosis not present

## 2021-12-08 DIAGNOSIS — R809 Proteinuria, unspecified: Secondary | ICD-10-CM | POA: Diagnosis not present

## 2021-12-08 DIAGNOSIS — E1122 Type 2 diabetes mellitus with diabetic chronic kidney disease: Secondary | ICD-10-CM | POA: Diagnosis not present

## 2022-04-15 DIAGNOSIS — J069 Acute upper respiratory infection, unspecified: Secondary | ICD-10-CM | POA: Diagnosis not present

## 2022-05-04 DIAGNOSIS — H40003 Preglaucoma, unspecified, bilateral: Secondary | ICD-10-CM | POA: Diagnosis not present

## 2022-05-10 ENCOUNTER — Ambulatory Visit (INDEPENDENT_AMBULATORY_CARE_PROVIDER_SITE_OTHER): Payer: Medicare Other

## 2022-05-10 VITALS — Ht 65.0 in | Wt 205.0 lb

## 2022-05-10 DIAGNOSIS — Z1231 Encounter for screening mammogram for malignant neoplasm of breast: Secondary | ICD-10-CM

## 2022-05-10 DIAGNOSIS — Z0000 Encounter for general adult medical examination without abnormal findings: Secondary | ICD-10-CM

## 2022-05-10 NOTE — Patient Instructions (Signed)
Julie [name] , Thank you for taking time to come for your Medicare Wellness Visit. I appreciate your ongoing commitment to your health goals. Please review the following plan we discussed and let me know if I can assist you in the future.   These are the goals we discussed:  Goals      DIET - EAT MORE FRUITS AND VEGETABLES     Weight (lb) < 175 lb (79.4 kg)     Recommend to eat 3 small meals a day with 2 healthy snacks in between to help aid with weight loss and diabetes control.         This is a list of the screening recommended for you and due dates:  Health Maintenance  Topic Date Due   Yearly kidney health urinalysis for diabetes  Never done   Zoster (Shingles) Vaccine (1 of 2) Never done   Complete foot exam   11/03/2020   DTaP/Tdap/Td vaccine (2 - Td or Tdap) 11/29/2020   Mammogram  12/17/2020   Hemoglobin A1C  04/18/2021   COVID-19 Vaccine (3 - 2023-24 season) 10/14/2021   Yearly kidney function blood test for diabetes  10/19/2021   Eye exam for diabetics  06/28/2022   Medicare Annual Wellness Visit  05/10/2023   DEXA scan (bone density measurement)  11/12/2024   Colon Cancer Screening  05/27/2026   Pneumonia Vaccine  Completed   Flu Shot  Completed   Hepatitis C Screening: USPSTF Recommendation to screen - Ages 18-79 yo.  Completed   HPV Vaccine  Aged Out    Advanced directives: no  Conditions/risks identified: none  Next appointment: Follow up in one year for your annual wellness visit 05/15/2023 @10 :15am telephone   Preventive Care 65 Years and Older, Female Preventive care refers to lifestyle choices and visits with your health care provider that can promote health and wellness. What does preventive care include? A yearly physical exam. This is also called an annual well check. Dental exams once or twice a year. Routine eye exams. Ask your health care provider how often you should have your eyes checked. Personal lifestyle choices, including: Daily care of  your teeth and gums. Regular physical activity. Eating a healthy diet. Avoiding tobacco and drug use. Limiting alcohol use. Practicing safe sex. Taking low-dose aspirin every day. Taking vitamin and mineral supplements as recommended by your health care provider. What happens during an annual well check? The services and screenings done by your health care provider during your annual well check will depend on your age, overall health, lifestyle risk factors, and family history of disease. Counseling  Your health care provider may ask you questions about your: Alcohol use. Tobacco use. Drug use. Emotional well-being. Home and relationship well-being. Sexual activity. Eating habits. History of falls. Memory and ability to understand (cognition). Work and work Statistician. Reproductive health. Screening  You may have the following tests or measurements: Height, weight, and BMI. Blood pressure. Lipid and cholesterol levels. These may be checked every 5 years, or more frequently if you are over 74 years old. Skin check. Lung cancer screening. You may have this screening every year starting at age 44 if you have a 30-pack-year history of smoking and currently smoke or have quit within the past 15 years. Fecal occult blood test (FOBT) of the stool. You may have this test every year starting at age 21. Flexible sigmoidoscopy or colonoscopy. You may have a sigmoidoscopy every 5 years or a colonoscopy every 10 years starting at age  50. Hepatitis C blood test. Hepatitis B blood test. Sexually transmitted disease (STD) testing. Diabetes screening. This is done by checking your blood sugar (glucose) after you have not eaten for a while (fasting). You may have this done every 1-3 years. Bone density scan. This is done to screen for osteoporosis. You may have this done starting at age 67. Mammogram. This may be done every 1-2 years. Talk to your health care provider about how often you should  have regular mammograms. Talk with your health care provider about your test results, treatment options, and if necessary, the need for more tests. Vaccines  Your health care provider may recommend certain vaccines, such as: Influenza vaccine. This is recommended every year. Tetanus, diphtheria, and acellular pertussis (Tdap, Td) vaccine. You may need a Td booster every 10 years. Zoster vaccine. You may need this after age 17. Pneumococcal 13-valent conjugate (PCV13) vaccine. One dose is recommended after age 20. Pneumococcal polysaccharide (PPSV23) vaccine. One dose is recommended after age 73. Talk to your health care provider about which screenings and vaccines you need and how often you need them. This information is not intended to replace advice given to you by your health care provider. Make sure you discuss any questions you have with your health care provider. Document Released: 02/26/2015 Document Revised: 10/20/2015 Document Reviewed: 12/01/2014 Elsevier Interactive Patient Education  2017 [employer] Prevention in the Home Falls can cause injuries. They can happen to people of all ages. There are many things you can do to make your home safe and to help prevent falls. What can I do on the outside of my home? Regularly fix the edges of walkways and driveways and fix any cracks. Remove anything that might make you trip as you walk through a door, such as a raised step or threshold. Trim any bushes or trees on the path to your home. Use bright outdoor lighting. Clear any walking paths of anything that might make someone trip, such as rocks or tools. Regularly check to see if handrails are loose or broken. Make sure that both sides of any steps have handrails. Any raised decks and porches should have guardrails on the edges. Have any leaves, snow, or ice cleared regularly. Use sand or salt on walking paths during winter. Clean up any spills in your garage right away. This  includes oil or grease spills. What can I do in the bathroom? Use night lights. Install grab bars by the toilet and in the tub and shower. Do not use towel bars as grab bars. Use non-skid mats or decals in the tub or shower. If you need to sit down in the shower, use a plastic, non-slip stool. Keep the floor dry. Clean up any water that spills on the floor as soon as it happens. Remove soap buildup in the tub or shower regularly. Attach bath mats securely with double-sided non-slip rug tape. Do not have throw rugs and other things on the floor that can make you trip. What can I do in the bedroom? Use night lights. Make sure that you have a light by your bed that is easy to reach. Do not use any sheets or blankets that are too big for your bed. They should not hang down onto the floor. Have a firm chair that has side arms. You can use this for support while you get dressed. Do not have throw rugs and other things on the floor that can make you trip. What can I do  in the kitchen? Clean up any spills right away. Avoid walking on wet floors. Keep items that you use a lot in easy-to-reach places. If you need to reach something above you, use a strong step stool that has a grab bar. Keep electrical cords out of the way. Do not use floor polish or wax that makes floors slippery. If you must use wax, use non-skid floor wax. Do not have throw rugs and other things on the floor that can make you trip. What can I do with my stairs? Do not leave any items on the stairs. Make sure that there are handrails on both sides of the stairs and use them. Fix handrails that are broken or loose. Make sure that handrails are as long as the stairways. Check any carpeting to make sure that it is firmly attached to the stairs. Fix any carpet that is loose or worn. Avoid having throw rugs at the top or bottom of the stairs. If you do have throw rugs, attach them to the floor with carpet tape. Make sure that you  have a light switch at the top of the stairs and the bottom of the stairs. If you do not have them, ask someone to add them for you. What else can I do to help prevent falls? Wear shoes that: Do not have high heels. Have rubber bottoms. Are comfortable and fit you well. Are closed at the toe. Do not wear sandals. If you use a stepladder: Make sure that it is fully opened. Do not climb a closed stepladder. Make sure that both sides of the stepladder are locked into place. Ask someone to hold it for you, if possible. Clearly mark and make sure that you can see: Any grab bars or handrails. First and last steps. Where the edge of each step is. Use tools that help you move around (mobility aids) if they are needed. These include: Canes. Walkers. Scooters. Crutches. Turn on the lights when you go into a dark area. Replace any light bulbs as soon as they burn out. Set up your furniture so you have a clear path. Avoid moving your furniture around. If any of your floors are uneven, fix them. If there are any pets around you, be aware of where they are. Review your medicines with your doctor. Some medicines can make you feel dizzy. This can increase your chance of falling. Ask your doctor what other things that you can do to help prevent falls. This information is not intended to replace advice given to you by your health care provider. Make sure you discuss any questions you have with your health care provider. Document Released: 11/26/2008 Document Revised: 07/08/2015 Document Reviewed: 03/06/2014 Elsevier Interactive Patient Education  2017 [employer].

## 2022-05-10 NOTE — Progress Notes (Signed)
I connected with  Julie [name] on 05/10/22 by a audio enabled telemedicine application and verified that I am speaking with the correct person using two identifiers.  Patient Location: Home  Provider Location: Office/Clinic  I discussed the limitations of evaluation and management by telemedicine. The patient expressed understanding and agreed to proceed.  Subjective:   Julie [name] is a 72 y.o. female who presents for Medicare Annual (Subsequent) preventive examination.  Review of Systems    Cardiac Risk Factors include: advanced age (>46men, >70 women);diabetes mellitus;hypertension;dyslipidemia;obesity (BMI >30kg/m2)    Objective:    Today's Vitals   05/10/22 1055  Weight: 205 lb (93 kg)  Height: 5\' 5"  (1.651 m)   Body mass index is 34.11 kg/m.     05/10/2022   11:10 AM 08/31/2021    9:29 AM 08/09/2021    8:07 AM 05/26/2021    9:14 AM 05/05/2021   10:26 AM 11/19/2019   11:22 AM 11/23/2018    5:53 PM  Advanced Directives  Does Patient Have a Medical Advance Directive? No No No No No No No  Would patient like information on creating a medical advance directive?  No - Patient declined Yes (MAU/Ambulatory/Procedural Areas - Information given)  No - Patient declined No - Patient declined No - Patient declined    Current Medications (verified) Outpatient Encounter Medications as of 05/10/2022  Medication Sig   atorvastatin (LIPITOR) 10 MG tablet Take 1 tablet by mouth once daily   Cholecalciferol 25 MCG (1000 UT) tablet Take 5,000 Units by mouth daily.   Cinnamon 500 MG TABS Take 1,000 mg by mouth daily.   Continuous Blood Gluc Sensor (FREESTYLE LIBRE 14 DAY SENSOR) MISC Use 1 kit every 14 (fourteen) days   Continuous Blood Gluc Sensor (FREESTYLE LIBRE 2 SENSOR) MISC Use 1 kit every 14 (fourteen) days for glucose monitoring   enalapril (VASOTEC) 20 MG tablet Take 40 mg by mouth daily.    insulin aspart (NOVOLOG) 100 UNIT/ML FlexPen Inject into the skin 3 (three) times  daily with meals. Takes 26 units @ breakfast, 28 units before lunch and 32 units before dinner.   insulin detemir (LEVEMIR FLEXTOUCH) 100 UNIT/ML FlexPen Inject into the skin.   insulin detemir (LEVEMIR) 100 UNIT/ML FlexPen Inject 52 Units into the skin at bedtime.    naproxen sodium (ALEVE) 220 MG tablet Take 220 mg by mouth daily as needed.   Omega-3 1000 MG CAPS Take by mouth daily.   Probiotic Product (PROBIOTIC DAILY PO) Take by mouth daily.   Semaglutide,0.25 or 0.5MG /DOS, 2 MG/1.5ML SOPN Inject 0.5 mg into the skin.   hydrochlorothiazide (HYDRODIURIL) 25 MG tablet Take by mouth.   meloxicam (MOBIC) 15 MG tablet Take 1 tablet (15 mg total) by mouth daily. (Patient not taking: Reported on 05/24/2021)   No facility-administered encounter medications on file as of 05/10/2022.    Allergies (verified) Patient has no known allergies.   History: Past Medical History:  Diagnosis Date   Anemia    Arthritis    Chronic kidney disease    Diabetes mellitus without complication (HCC)    Diabetic acidosis (HCC)    Hepatitis    hepatitis c, treated   History of peptic ulcer disease    Hypertension    Microalbuminuria    Wears dentures    full upper and lower   Past Surgical History:  Procedure Laterality Date   ABDOMINAL HYSTERECTOMY  1985   CATARACT EXTRACTION W/PHACO Left 08/09/2021   Procedure: CATARACT EXTRACTION PHACO  AND INTRAOCULAR LENS PLACEMENT (IOC) LEFT DIABETIC 6.88 01:11.9;  Surgeon: Leandrew Koyanagi, MD;  Location: [place];  Service: Ophthalmology;  Laterality: Left;  Diabetic   CATARACT EXTRACTION W/PHACO Right 08/31/2021   Procedure: CATARACT EXTRACTION PHACO AND INTRAOCULAR LENS PLACEMENT (IOC) RIGHT DIABETIC 5.16 00:50.8;  Surgeon: Leandrew Koyanagi, MD;  Location: [place];  Service: Ophthalmology;  Laterality: Right;  Diabetic   COLONOSCOPY WITH PROPOFOL N/A 07/31/2016   Procedure: COLONOSCOPY WITH PROPOFOL;  Surgeon: Lollie Sails, MD;   Location: [facility] ENDOSCOPY;  Service: Endoscopy;  Laterality: N/A;   COLONOSCOPY WITH PROPOFOL N/A 05/26/2021   Procedure: COLONOSCOPY WITH PROPOFOL;  Surgeon: Jonathon Bellows, MD;  Location: [facility] ENDOSCOPY;  Service: Gastroenterology;  Laterality: N/A;  Cell [phone])   DG  BONE DENSITY ([place] HX)     EXCISION MASS NECK Left 12/29/2016   Procedure: CHEST WALL MASS EXCISION;  Surgeon: Robert Bellow, MD;  Location: [facility] ORS;  Service: General;  Laterality: Left;   LAPAROSCOPIC HYSTERECTOMY     assisted vaginal hysterectomy, fibroids, ovaries removed   LIVER BIOPSY  2009   + hepatitis C, referred to [facility] by Gustavo Lah 04/2008.   NEPHROSTOMY W/ INTRODUCTION OF CATHETER     TUBAL LIGATION     Family History  Problem Relation Age of Onset   Arthritis Mother    Hypertension Mother    Stroke Father    Alcohol abuse Father    Cirrhosis Father    Cancer Maternal Aunt    Diabetes Son    Pancreatic disease Brother    Healthy Brother    Healthy Brother    Breast cancer Neg Hx    Social History   Socioeconomic History   Marital status: Married    Spouse name: Not on file   Number of children: 4   Years of education: Not on file   Highest education level: Associate degree: occupational, Hotel manager, or vocational program  Occupational History   Occupation: Driving a school bus    Comment: full time (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)  Tobacco Use   Smoking status: Former    Packs/day: 0.75    Years: 36.00    Additional pack years: 0.00    Total pack years: 27.00    Types: Cigarettes    Quit date: 02/13/2005    Years since quitting: 17.2   Smokeless tobacco: Never  Vaping Use   Vaping Use: Never used  Substance and Sexual Activity   Alcohol use: No   Drug use: No   Sexual activity: Yes  Other Topics Concern   Not on file  Social History Narrative   Not on file   Social Determinants of Health   Financial Resource Strain: Low Risk  (05/10/2022)   Overall Financial Resource Strain  (CARDIA)    Difficulty of Paying Living Expenses: Not hard at all  Food Insecurity: No Food Insecurity (05/10/2022)   Hunger Vital Sign    Worried About Running Out of Food in the Last Year: Never true    Ran Out of Food in the Last Year: Never true  Transportation Needs: No Transportation Needs (05/10/2022)   PRAPARE - Hydrologist (Medical): No    Lack of Transportation (Non-Medical): No  Physical Activity: Sufficiently Active (05/10/2022)   Exercise Vital Sign    Days of Exercise per Week: 4 days    Minutes of Exercise per Session: 40 min  Stress: No Stress Concern Present (05/10/2022)   [place]  [employer] - Occupational Stress Questionnaire    Feeling of Stress : Not at all  Social Connections: Moderately Integrated (05/10/2022)   Social Connection and Isolation Panel [NHANES]    Frequency of Communication with Friends and Family: More than three times a week    Frequency of Social Gatherings with Friends and Family: Twice a week    Attends Religious Services: More than 4 times per year    Active Member of [employer] or Organizations: No    Attends Music therapist: Never    Marital Status: Married    Tobacco Counseling Counseling given: Not Answered   Clinical Intake:     Pain : No/denies pain     BMI - recorded: 34.11 Nutritional Status: BMI > 30  Obese Nutritional Risks: None Diabetes: Yes CBG done?: No (pt says BS 69 this am and now; average BS 120;has continuos monitor) Did pt. bring in CBG monitor from home?: No  How often do you need to have someone help you when you read instructions, pamphlets, or other written materials from your doctor or pharmacy?: 1 - Never  Diabetic?yes  Interpreter Needed?: No  Comments: lives w/husband Information entered by :: B.Willene Holian,LPN   Activities of Daily Living    05/10/2022   11:10 AM 08/31/2021    9:30 AM  In your present state of health, do you have any  difficulty performing the following activities:  Hearing? 0 0  Vision? 0 0  Difficulty concentrating or making decisions? 0 0  Walking or climbing stairs? 0 0  Dressing or bathing? 0 0  Doing errands, shopping? 0   Preparing Food and eating ? N   Using the Toilet? N   In the past six months, have you accidently leaked urine? N   Do you have problems with loss of bowel control? N   Managing your Medications? N   Managing your Finances? N   Housekeeping or managing your Housekeeping? N     Patient Care Team: Gwyneth Sprout, FNP as PCP - General (Family Medicine) Bary Castilla Forest Gleason, MD as Consulting Physician (General Surgery) Ocie Doyne, [place] (Optometry) Solum, Betsey Holiday, MD as Physician Assistant (Endocrinology)  Indicate any recent Medical Services you may have received from other than [facility] providers in the past year (date may be approximate).     Assessment:   This is a routine wellness examination for [facility].  Hearing/Vision screen Hearing Screening - Comments:: Adequate hearing Vision Screening - Comments:: Adequate vision after cataract surgery (5/26) Dr Brazington/[facility]  Dietary issues and exercise activities discussed: Current Exercise Habits: Home exercise routine, Type of exercise: walking, Time (Minutes): 30, Frequency (Times/Week): 5, Weekly Exercise (Minutes/Week): 150   Goals Addressed   None    Depression Screen    05/10/2022   11:04 AM 05/05/2021   10:23 AM 02/23/2021   11:41 AM 10/21/2020    8:24 AM 11/19/2019   11:15 AM 10/25/2018    2:14 PM 08/23/2017    3:12 PM  PHQ 2/9 Scores  PHQ - 2 Score 0 0 0 0 0 0 0  PHQ- 9 Score   4 3  1      Fall Risk    05/10/2022   11:01 AM 05/05/2021   10:27 AM 02/23/2021   11:41 AM 10/21/2020    8:24 AM 11/19/2019   11:24 AM  Fall Risk   Falls in the past year? 0 0 0 0 0  Number falls in past yr: 0 0  0 0 0  Injury with Fall? 0 0 0 0 0  Risk for fall due to : No Fall Risks No Fall Risks Impaired balance/gait;Orthopedic  patient No Fall Risks   Follow up Education provided;Falls prevention discussed Falls evaluation completed Falls evaluation completed      FALL RISK PREVENTION PERTAINING TO THE HOME:  Any stairs in or around the home? No  If so, are there any without handrails? No  Home free of loose throw rugs in walkways, pet beds, electrical cords, etc? Yes  Adequate lighting in your home to reduce risk of falls? Yes   ASSISTIVE DEVICES UTILIZED TO PREVENT FALLS:  Life alert? No  Use of a cane, walker or w/c? No  Grab bars in the bathroom? No  Shower chair or bench in shower? No  Elevated toilet seat or a handicapped toilet? No   Cognitive Function:        05/10/2022   11:13 AM  6CIT Screen  What Year? 0 points  What month? 0 points  What time? 0 points  Count back from 20 0 points  Months in reverse 0 points  Repeat phrase 0 points  Total Score 0 points    Immunizations Immunization History  Administered Date(s) Administered   Fluad Quad(high Dose 65+) 10/25/2018   Hepatitis B 10/26/2008, 11/25/2008   Influenza Split 11/19/2007, 01/26/2009, 11/30/2010, 11/13/2014   Influenza, High Dose Seasonal PF 12/21/2016   Influenza-Unspecified 11/14/2014, 10/14/2017   Moderna Sars-Covid-2 Vaccination 03/28/2019, 04/25/2019   Pneumococcal Conjugate-13 04/23/2015   Pneumococcal Polysaccharide-23 11/19/2007, 03/26/2017   Tdap 11/30/2010    TDAP status: Up to date  Flu Vaccine status: Up to date  Pneumococcal vaccine status: Up to date  Covid-19 vaccine status: Completed vaccines  Qualifies for Shingles Vaccine? Yes   Zostavax completed No   Shingrix Completed?: No.    Education has been provided regarding the importance of this vaccine. Patient has been advised to call insurance company to determine out of pocket expense if they have not yet received this vaccine. Advised may also receive vaccine at local pharmacy or Health Dept. Verbalized acceptance and understanding.  Screening  Tests Health Maintenance  Topic Date Due   Diabetic kidney evaluation - Urine ACR  Never done   Zoster Vaccines- Shingrix (1 of 2) Never done   FOOT EXAM  11/03/2020   DTaP/Tdap/Td (2 - Td or Tdap) 11/29/2020   MAMMOGRAM  12/17/2020   HEMOGLOBIN A1C  04/18/2021   COVID-19 Vaccine (3 - 2023-24 season) 10/14/2021   Diabetic kidney evaluation - eGFR measurement  10/19/2021   OPHTHALMOLOGY EXAM  06/28/2022   Medicare Annual Wellness (AWV)  05/10/2023   DEXA SCAN  11/12/2024   COLONOSCOPY (Pts 45-25yrs Insurance coverage will need to be confirmed)  05/27/2026   Pneumonia Vaccine 12+ Years old  Completed   INFLUENZA VACCINE  Completed   Hepatitis C Screening  Completed   HPV VACCINES  Aged Out    Health Maintenance  Health Maintenance Due  Topic Date Due   Diabetic kidney evaluation - Urine ACR  Never done   Zoster Vaccines- Shingrix (1 of 2) Never done   FOOT EXAM  11/03/2020   DTaP/Tdap/Td (2 - Td or Tdap) 11/29/2020   MAMMOGRAM  12/17/2020   HEMOGLOBIN A1C  04/18/2021   COVID-19 Vaccine (3 - 2023-24 season) 10/14/2021   Diabetic kidney evaluation - eGFR measurement  10/19/2021    Colorectal cancer screening: Type of screening: Colonoscopy. Completed yes. Repeat every 5 years  Mammogram status: Ordered yes. Pt provided with contact info and advised to call to schedule appt.   Bone Density status: Completed yes. Results reflect: Bone density results: NORMAL. Repeat every 5 years.  Lung Cancer Screening: (Low Dose CT Chest recommended if Age 77-80 years, 30 pack-year currently smoking OR have quit w/in 15years.) does not qualify.   Lung Cancer Screening Referral: no  Additional Screening:  Hepatitis C Screening: does not qualify; Completed yes  Vision Screening: Recommended annual ophthalmology exams for early detection of glaucoma and other disorders of the eye. Is the patient up to date with their annual eye exam?  Yes  Who is the provider or what is the name of the  office in which the patient attends annual eye exams? [place] If pt is not established with a provider, would they like to be referred to a provider to establish care? No .   Dental Screening: Recommended annual dental exams for proper oral hygiene  Community Resource Referral / Chronic Care Management: CRR required this visit?  No  CCM required this visit?  No      Plan:     I have personally reviewed and noted the following in the patient's chart:   Medical and social history Use of alcohol, tobacco or illicit drugs  Current medications and supplements including opioid prescriptions. Patient is not currently taking opioid prescriptions. Functional ability and status Nutritional status Physical activity Advanced directives List of other physicians Hospitalizations, surgeries, and ER visits in previous 12 months Vitals Screenings to include cognitive, depression, and falls Referrals and appointments  In addition, I have reviewed and discussed with patient certain preventive protocols, quality metrics, and best practice recommendations. A written personalized care plan for preventive services as well as general preventive health recommendations were provided to patient.     Roger Shelter, LPN   [id number]   Nurse Notes: pt is doing well: she has no questions or concerns. She requests advance directives paperwork and forms: will mail today. MMG order placed as due now. Pt aware and will complete.

## 2022-05-11 DIAGNOSIS — Z961 Presence of intraocular lens: Secondary | ICD-10-CM | POA: Diagnosis not present

## 2022-05-11 DIAGNOSIS — H40003 Preglaucoma, unspecified, bilateral: Secondary | ICD-10-CM | POA: Diagnosis not present

## 2022-05-11 DIAGNOSIS — E119 Type 2 diabetes mellitus without complications: Secondary | ICD-10-CM | POA: Diagnosis not present

## 2022-05-11 DIAGNOSIS — H43813 Vitreous degeneration, bilateral: Secondary | ICD-10-CM | POA: Diagnosis not present

## 2022-05-11 LAB — HM DIABETES EYE EXAM

## 2022-05-12 ENCOUNTER — Encounter: Payer: Self-pay | Admitting: Family Medicine

## 2022-06-05 DIAGNOSIS — R809 Proteinuria, unspecified: Secondary | ICD-10-CM | POA: Diagnosis not present

## 2022-06-05 DIAGNOSIS — Z794 Long term (current) use of insulin: Secondary | ICD-10-CM | POA: Diagnosis not present

## 2022-06-05 DIAGNOSIS — E1129 Type 2 diabetes mellitus with other diabetic kidney complication: Secondary | ICD-10-CM | POA: Diagnosis not present

## 2022-06-12 DIAGNOSIS — Z794 Long term (current) use of insulin: Secondary | ICD-10-CM | POA: Diagnosis not present

## 2022-06-12 DIAGNOSIS — E785 Hyperlipidemia, unspecified: Secondary | ICD-10-CM | POA: Diagnosis not present

## 2022-06-12 DIAGNOSIS — N1832 Chronic kidney disease, stage 3b: Secondary | ICD-10-CM | POA: Diagnosis not present

## 2022-06-12 DIAGNOSIS — E1129 Type 2 diabetes mellitus with other diabetic kidney complication: Secondary | ICD-10-CM | POA: Diagnosis not present

## 2022-06-12 DIAGNOSIS — R809 Proteinuria, unspecified: Secondary | ICD-10-CM | POA: Diagnosis not present

## 2022-06-12 DIAGNOSIS — E1159 Type 2 diabetes mellitus with other circulatory complications: Secondary | ICD-10-CM | POA: Diagnosis not present

## 2022-06-12 DIAGNOSIS — I10 Essential (primary) hypertension: Secondary | ICD-10-CM | POA: Diagnosis not present

## 2022-06-12 DIAGNOSIS — E1122 Type 2 diabetes mellitus with diabetic chronic kidney disease: Secondary | ICD-10-CM | POA: Diagnosis not present

## 2022-06-12 DIAGNOSIS — E1169 Type 2 diabetes mellitus with other specified complication: Secondary | ICD-10-CM | POA: Diagnosis not present

## 2022-07-23 DIAGNOSIS — E119 Type 2 diabetes mellitus without complications: Secondary | ICD-10-CM | POA: Diagnosis not present

## 2022-07-23 DIAGNOSIS — M7662 Achilles tendinitis, left leg: Secondary | ICD-10-CM | POA: Diagnosis not present

## 2022-07-26 ENCOUNTER — Other Ambulatory Visit: Payer: Self-pay

## 2022-07-26 DIAGNOSIS — E1122 Type 2 diabetes mellitus with diabetic chronic kidney disease: Secondary | ICD-10-CM | POA: Diagnosis not present

## 2022-07-26 DIAGNOSIS — N1832 Chronic kidney disease, stage 3b: Secondary | ICD-10-CM | POA: Diagnosis not present

## 2022-07-26 DIAGNOSIS — I10 Essential (primary) hypertension: Secondary | ICD-10-CM | POA: Diagnosis not present

## 2022-07-26 DIAGNOSIS — R809 Proteinuria, unspecified: Secondary | ICD-10-CM | POA: Diagnosis not present

## 2022-07-27 DIAGNOSIS — U071 COVID-19: Secondary | ICD-10-CM | POA: Diagnosis not present

## 2022-07-27 DIAGNOSIS — Z20822 Contact with and (suspected) exposure to covid-19: Secondary | ICD-10-CM | POA: Diagnosis not present

## 2022-08-09 ENCOUNTER — Ambulatory Visit
Admission: RE | Admit: 2022-08-09 | Discharge: 2022-08-09 | Disposition: A | Discharge status: Home / Self Care | Payer: Medicare Other | Source: Ambulatory Visit | Attending: Nephrology | Admitting: Nephrology

## 2022-08-09 DIAGNOSIS — N1832 Chronic kidney disease, stage 3b: Secondary | ICD-10-CM | POA: Diagnosis not present

## 2022-08-09 DIAGNOSIS — E1122 Type 2 diabetes mellitus with diabetic chronic kidney disease: Secondary | ICD-10-CM | POA: Diagnosis not present

## 2022-08-09 DIAGNOSIS — N189 Chronic kidney disease, unspecified: Secondary | ICD-10-CM | POA: Diagnosis not present

## 2022-08-24 DIAGNOSIS — I10 Essential (primary) hypertension: Secondary | ICD-10-CM | POA: Diagnosis not present

## 2022-08-24 DIAGNOSIS — N1832 Chronic kidney disease, stage 3b: Secondary | ICD-10-CM | POA: Diagnosis not present

## 2022-08-24 DIAGNOSIS — E1122 Type 2 diabetes mellitus with diabetic chronic kidney disease: Secondary | ICD-10-CM | POA: Diagnosis not present

## 2022-08-24 DIAGNOSIS — R809 Proteinuria, unspecified: Secondary | ICD-10-CM | POA: Diagnosis not present

## 2022-09-06 DIAGNOSIS — N1832 Chronic kidney disease, stage 3b: Secondary | ICD-10-CM | POA: Diagnosis not present

## 2022-09-06 DIAGNOSIS — R809 Proteinuria, unspecified: Secondary | ICD-10-CM | POA: Diagnosis not present

## 2022-09-06 DIAGNOSIS — I10 Essential (primary) hypertension: Secondary | ICD-10-CM | POA: Diagnosis not present

## 2022-09-06 DIAGNOSIS — E1122 Type 2 diabetes mellitus with diabetic chronic kidney disease: Secondary | ICD-10-CM | POA: Diagnosis not present

## 2022-11-13 DIAGNOSIS — Z961 Presence of intraocular lens: Secondary | ICD-10-CM | POA: Diagnosis not present

## 2022-11-13 DIAGNOSIS — H40003 Preglaucoma, unspecified, bilateral: Secondary | ICD-10-CM | POA: Diagnosis not present

## 2022-12-06 DIAGNOSIS — Z794 Long term (current) use of insulin: Secondary | ICD-10-CM | POA: Diagnosis not present

## 2022-12-06 DIAGNOSIS — E1129 Type 2 diabetes mellitus with other diabetic kidney complication: Secondary | ICD-10-CM | POA: Diagnosis not present

## 2022-12-06 DIAGNOSIS — R809 Proteinuria, unspecified: Secondary | ICD-10-CM | POA: Diagnosis not present

## 2022-12-15 DIAGNOSIS — Z794 Long term (current) use of insulin: Secondary | ICD-10-CM | POA: Diagnosis not present

## 2022-12-15 DIAGNOSIS — E1122 Type 2 diabetes mellitus with diabetic chronic kidney disease: Secondary | ICD-10-CM | POA: Diagnosis not present

## 2022-12-15 DIAGNOSIS — E785 Hyperlipidemia, unspecified: Secondary | ICD-10-CM | POA: Diagnosis not present

## 2022-12-15 DIAGNOSIS — E1129 Type 2 diabetes mellitus with other diabetic kidney complication: Secondary | ICD-10-CM | POA: Diagnosis not present

## 2022-12-15 DIAGNOSIS — E1159 Type 2 diabetes mellitus with other circulatory complications: Secondary | ICD-10-CM | POA: Diagnosis not present

## 2022-12-15 DIAGNOSIS — E1169 Type 2 diabetes mellitus with other specified complication: Secondary | ICD-10-CM | POA: Diagnosis not present

## 2022-12-15 DIAGNOSIS — N1831 Chronic kidney disease, stage 3a: Secondary | ICD-10-CM | POA: Diagnosis not present

## 2022-12-15 DIAGNOSIS — I10 Essential (primary) hypertension: Secondary | ICD-10-CM | POA: Diagnosis not present

## 2022-12-15 DIAGNOSIS — R809 Proteinuria, unspecified: Secondary | ICD-10-CM | POA: Diagnosis not present

## 2023-05-14 DIAGNOSIS — H40003 Preglaucoma, unspecified, bilateral: Secondary | ICD-10-CM | POA: Diagnosis not present

## 2023-05-15 DIAGNOSIS — M7732 Calcaneal spur, left foot: Secondary | ICD-10-CM | POA: Diagnosis not present

## 2023-05-15 DIAGNOSIS — M79672 Pain in left foot: Secondary | ICD-10-CM | POA: Diagnosis not present

## 2023-05-15 DIAGNOSIS — M25571 Pain in right ankle and joints of right foot: Secondary | ICD-10-CM | POA: Diagnosis not present

## 2023-05-21 DIAGNOSIS — E119 Type 2 diabetes mellitus without complications: Secondary | ICD-10-CM | POA: Diagnosis not present

## 2023-05-21 DIAGNOSIS — Z961 Presence of intraocular lens: Secondary | ICD-10-CM | POA: Diagnosis not present

## 2023-05-21 DIAGNOSIS — H43813 Vitreous degeneration, bilateral: Secondary | ICD-10-CM | POA: Diagnosis not present

## 2023-05-21 DIAGNOSIS — H40003 Preglaucoma, unspecified, bilateral: Secondary | ICD-10-CM | POA: Diagnosis not present

## 2023-05-21 LAB — HM DIABETES EYE EXAM

## 2023-06-27 DIAGNOSIS — R809 Proteinuria, unspecified: Secondary | ICD-10-CM | POA: Diagnosis not present

## 2023-06-27 DIAGNOSIS — Z794 Long term (current) use of insulin: Secondary | ICD-10-CM | POA: Diagnosis not present

## 2023-06-27 DIAGNOSIS — E1129 Type 2 diabetes mellitus with other diabetic kidney complication: Secondary | ICD-10-CM | POA: Diagnosis not present

## 2023-07-02 DIAGNOSIS — E1122 Type 2 diabetes mellitus with diabetic chronic kidney disease: Secondary | ICD-10-CM | POA: Diagnosis not present

## 2023-07-02 DIAGNOSIS — E785 Hyperlipidemia, unspecified: Secondary | ICD-10-CM | POA: Diagnosis not present

## 2023-07-02 DIAGNOSIS — E1159 Type 2 diabetes mellitus with other circulatory complications: Secondary | ICD-10-CM | POA: Diagnosis not present

## 2023-07-02 DIAGNOSIS — I10 Essential (primary) hypertension: Secondary | ICD-10-CM | POA: Diagnosis not present

## 2023-07-02 DIAGNOSIS — R809 Proteinuria, unspecified: Secondary | ICD-10-CM | POA: Diagnosis not present

## 2023-07-02 DIAGNOSIS — E1169 Type 2 diabetes mellitus with other specified complication: Secondary | ICD-10-CM | POA: Diagnosis not present

## 2023-07-02 DIAGNOSIS — Z794 Long term (current) use of insulin: Secondary | ICD-10-CM | POA: Diagnosis not present

## 2023-07-02 DIAGNOSIS — N1832 Chronic kidney disease, stage 3b: Secondary | ICD-10-CM | POA: Diagnosis not present

## 2023-07-02 DIAGNOSIS — E1129 Type 2 diabetes mellitus with other diabetic kidney complication: Secondary | ICD-10-CM | POA: Diagnosis not present

## 2023-08-18 DIAGNOSIS — M7732 Calcaneal spur, left foot: Secondary | ICD-10-CM | POA: Diagnosis not present

## 2023-08-18 DIAGNOSIS — M79672 Pain in left foot: Secondary | ICD-10-CM | POA: Diagnosis not present

## 2023-10-01 ENCOUNTER — Ambulatory Visit: Payer: Self-pay | Admitting: Internal Medicine

## 2023-10-01 ENCOUNTER — Ambulatory Visit (INDEPENDENT_AMBULATORY_CARE_PROVIDER_SITE_OTHER): Admitting: Internal Medicine

## 2023-10-01 ENCOUNTER — Encounter: Payer: Self-pay | Admitting: Internal Medicine

## 2023-10-01 VITALS — BP 122/86 | HR 84 | Ht 65.0 in | Wt 188.8 lb

## 2023-10-01 DIAGNOSIS — N1832 Chronic kidney disease, stage 3b: Secondary | ICD-10-CM | POA: Diagnosis not present

## 2023-10-01 DIAGNOSIS — Z8711 Personal history of peptic ulcer disease: Secondary | ICD-10-CM

## 2023-10-01 DIAGNOSIS — E1159 Type 2 diabetes mellitus with other circulatory complications: Secondary | ICD-10-CM | POA: Diagnosis not present

## 2023-10-01 DIAGNOSIS — E559 Vitamin D deficiency, unspecified: Secondary | ICD-10-CM | POA: Diagnosis not present

## 2023-10-01 DIAGNOSIS — I340 Nonrheumatic mitral (valve) insufficiency: Secondary | ICD-10-CM | POA: Diagnosis not present

## 2023-10-01 DIAGNOSIS — D126 Benign neoplasm of colon, unspecified: Secondary | ICD-10-CM

## 2023-10-01 DIAGNOSIS — E782 Mixed hyperlipidemia: Secondary | ICD-10-CM | POA: Diagnosis not present

## 2023-10-01 DIAGNOSIS — Z794 Long term (current) use of insulin: Secondary | ICD-10-CM

## 2023-10-01 DIAGNOSIS — E1165 Type 2 diabetes mellitus with hyperglycemia: Secondary | ICD-10-CM | POA: Diagnosis not present

## 2023-10-01 DIAGNOSIS — I152 Hypertension secondary to endocrine disorders: Secondary | ICD-10-CM | POA: Diagnosis not present

## 2023-10-01 DIAGNOSIS — Z1231 Encounter for screening mammogram for malignant neoplasm of breast: Secondary | ICD-10-CM | POA: Diagnosis not present

## 2023-10-01 DIAGNOSIS — M810 Age-related osteoporosis without current pathological fracture: Secondary | ICD-10-CM | POA: Diagnosis not present

## 2023-10-01 DIAGNOSIS — E1169 Type 2 diabetes mellitus with other specified complication: Secondary | ICD-10-CM

## 2023-10-01 DIAGNOSIS — Z13820 Encounter for screening for osteoporosis: Secondary | ICD-10-CM

## 2023-10-01 LAB — POCT CBG (FASTING - GLUCOSE)-MANUAL ENTRY: Glucose Fasting, POC: 116 mg/dL — AB (ref 70–99)

## 2023-10-01 NOTE — Progress Notes (Signed)
 New Patient Office Visit  Subjective   Patient ID: [name], female    DOB: 1950/09/02  Age: 73 y.o. MRN: [id number]  CC:  Chief Complaint  Patient presents with   Establish Care    NPE    HPI Julie [name] presents to establish care Previous Primary Care provider/office:   she does not have additional concerns to discuss today.   Patient comes in to establish PMD.  She has several medical problems as listed.  She is under the care of an endocrinologist for her diabetes and nephrologist for her CKD.  Today she is feeling well and has no new complaints.  However she mentions that she is overdue for her mammogram and bone density.  She is status post a total hysterectomy for fibroids.  Her most recent colonoscopy was 2023, gets every 5 years due to history of adenomatous polyps. Today she is feeling well, and has no complaints of headaches or dizziness, no chest pain, no shortness of breath and no palpitations.    Outpatient Encounter Medications as of 10/01/2023  Medication Sig   atorvastatin  (LIPITOR) 10 MG tablet Take 1 tablet by mouth daily.   Cholecalciferol 25 MCG (1000 UT) tablet Take 5,000 Units by mouth daily.   Continuous Blood Gluc Sensor (FREESTYLE LIBRE 2 SENSOR) MISC Use 1 kit every 14 (fourteen) days for glucose monitoring   enalapril (VASOTEC) 20 MG tablet Take 40 mg by mouth at bedtime.   FARXIGA 10 MG TABS tablet Take 10 mg by mouth daily.   hydrochlorothiazide (HYDRODIURIL) 25 MG tablet Take 1 tablet by mouth daily.   insulin aspart (NOVOLOG) 100 UNIT/ML FlexPen Inject into the skin 3 (three) times daily with meals. Takes 26 units @ breakfast, 28 units before lunch and 32 units before dinner.   LANTUS SOLOSTAR 100 UNIT/ML Solostar Pen Inject 40 Units into the skin at bedtime.   MOUNJARO 7.5 MG/0.5ML Pen Inject 7.5 mg into the skin once a week.   atorvastatin  (LIPITOR) 10 MG tablet Take 1 tablet by mouth once daily (Patient not taking: Reported on  10/01/2023)   Cinnamon 500 MG TABS Take 1,000 mg by mouth daily. (Patient not taking: Reported on 10/01/2023)   Continuous Blood Gluc Sensor (FREESTYLE LIBRE 14 DAY SENSOR) MISC Use 1 kit every 14 (fourteen) days (Patient not taking: Reported on 10/01/2023)   enalapril (VASOTEC) 20 MG tablet Take 40 mg by mouth daily.  (Patient not taking: Reported on 10/01/2023)   hydrochlorothiazide (HYDRODIURIL) 25 MG tablet Take by mouth. (Patient not taking: Reported on 10/01/2023)   insulin detemir (LEVEMIR FLEXTOUCH) 100 UNIT/ML FlexPen Inject into the skin. (Patient not taking: Reported on 10/01/2023)   insulin detemir (LEVEMIR) 100 UNIT/ML FlexPen Inject 52 Units into the skin at bedtime.  (Patient not taking: Reported on 10/01/2023)   meloxicam  (MOBIC ) 15 MG tablet Take 1 tablet (15 mg total) by mouth daily. (Patient not taking: Reported on 10/01/2023)   naproxen  sodium (ALEVE ) 220 MG tablet Take 220 mg by mouth daily as needed. (Patient not taking: Reported on 10/01/2023)   Omega-3 1000 MG CAPS Take by mouth daily. (Patient not taking: Reported on 10/01/2023)   Probiotic Product (PROBIOTIC DAILY PO) Take by mouth daily. (Patient not taking: Reported on 10/01/2023)   Semaglutide,0.25 or 0.5MG /DOS, 2 MG/1.5ML SOPN Inject 0.5 mg into the skin. (Patient not taking: Reported on 10/01/2023)   No facility-administered encounter medications on file as of 10/01/2023.    Past Medical History:  Diagnosis Date  Anemia    Arthritis    Chronic kidney disease    Diabetes mellitus without complication (HCC)    Diabetic acidosis (HCC)    Hepatitis    hepatitis c, treated   History of peptic ulcer disease    Hypertension    Microalbuminuria    Wears dentures    full upper and lower    Past Surgical History:  Procedure Laterality Date   ABDOMINAL HYSTERECTOMY  1985   CATARACT EXTRACTION W/PHACO Left 08/09/2021   Procedure: CATARACT EXTRACTION PHACO AND INTRAOCULAR LENS PLACEMENT (IOC) LEFT DIABETIC 6.88 01:11.9;   Surgeon: Mittie Gaskin, MD;  Location: [facility];  Service: Ophthalmology;  Laterality: Left;  Diabetic   CATARACT EXTRACTION W/PHACO Right 08/31/2021   Procedure: CATARACT EXTRACTION PHACO AND INTRAOCULAR LENS PLACEMENT (IOC) RIGHT DIABETIC 5.16 00:50.8;  Surgeon: Mittie Gaskin, MD;  Location: [facility];  Service: Ophthalmology;  Laterality: Right;  Diabetic   COLONOSCOPY WITH PROPOFOL  N/A 07/31/2016   Procedure: COLONOSCOPY WITH PROPOFOL ;  Surgeon: Gaylyn Gladis PENNER, MD;  Location: [facility] END[facility];  Service: Endoscopy;  Laterality: N/A;   COLONOSCOPY WITH PROPOFOL  N/A 05/26/2021   Procedure: COLONOSCOPY WITH PROPOFOL ;  Surgeon: Therisa Bi, MD;  Location: [facility] ENDOSCOPY;  Service: Gastroenterology;  Laterality: N/A;  Cell [phone])   DG  BONE DENSITY ([facility] HX)     EXCISION MASS NECK Left 12/29/2016   Procedure: CHEST WALL MASS EXCISION;  Surgeon: Dessa Reyes ORN, MD;  Location: [facility] ORS;  Service: General;  Laterality: Left;   LAPAROSCOPIC HYSTERECTOMY     assisted vaginal hysterectomy, fibroids, ovaries removed   LIVER BIOPSY  2009   + hepatitis C, referred to [facility] by Gaylyn 04/2008.   NEPHROSTOMY W/ INTRODUCTION OF CATHETER     TUBAL LIGATION      Family History  Problem Relation Age of Onset   Arthritis Mother    Hypertension Mother    Stroke Father    Alcohol abuse Father    Cirrhosis Father    Cancer Maternal Aunt    Diabetes Son    Pancreatic disease Brother    Healthy Brother    Healthy Brother    Breast cancer Neg Hx     Social History   Socioeconomic History   Marital status: Married    Spouse name: Not on file   Number of children: 4   Years of education: Not on file   Highest education level: Associate degree: occupational, Scientist, product/process development, or vocational program  Occupational History   Occupation: Driving a school bus    Comment: full time (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)  Tobacco Use   Smoking status: Former    Current  packs/day: 0.00    Average packs/day: 0.8 packs/day for 36.0 years (27.0 ttl pk-yrs)    Types: Cigarettes    Start date: 02/13/1969    Quit date: 02/13/2005    Years since quitting: 18.6   Smokeless tobacco: Never  Vaping Use   Vaping status: Never Used  Substance and Sexual Activity   Alcohol use: No   Drug use: No   Sexual activity: Yes  Other Topics Concern   Not on file  Social History Narrative   Not on file   Social Drivers of Health   Financial Resource Strain: Low Risk  (05/10/2022)   Overall Financial Resource Strain (CARDIA)    Difficulty of Paying Living Expenses: Not hard at all  Food Insecurity: No Food Insecurity (05/10/2022)   Hunger Vital Sign  Worried About Programme researcher, broadcasting/film/video in the Last Year: Never true    Ran Out of Food in the Last Year: Never true  Transportation Needs: No Transportation Needs (05/10/2022)   PRAPARE - Administrator, Civil Service (Medical): No    Lack of Transportation (Non-Medical): No  Physical Activity: Sufficiently Active (05/10/2022)   Exercise Vital Sign    Days of Exercise per Week: 4 days    Minutes of Exercise per Session: 40 min  Stress: No Stress Concern Present (05/10/2022)   [employer] of Occupational Health - Occupational Stress Questionnaire    Feeling of Stress : Not at all  Social Connections: Moderately Integrated (05/10/2022)   Social Connection and Isolation Panel    Frequency of Communication with Friends and Family: More than three times a week    Frequency of Social Gatherings with Friends and Family: Twice a week    Attends Religious Services: More than 4 times per year    Active Member of [employer] or Organizations: No    Attends Banker Meetings: Never    Marital Status: Married  Catering manager Violence: Not At Risk (05/10/2022)   Humiliation, Afraid, Rape, and Kick questionnaire    Fear of Current or Ex-Partner: No    Emotionally Abused: No    Physically Abused: No    Sexually  Abused: No    Review of Systems  Constitutional: Negative.  Negative for chills, diaphoresis, fever, malaise/fatigue and weight loss.  HENT: Negative.  Negative for ear discharge and sore throat.   Eyes: Negative.   Respiratory: Negative.  Negative for cough, shortness of breath and stridor.   Cardiovascular: Negative.  Negative for chest pain, palpitations and leg swelling.  Gastrointestinal: Negative.  Negative for abdominal pain, constipation, diarrhea, heartburn, nausea and vomiting.  Genitourinary: Negative.  Negative for dysuria and flank pain.  Musculoskeletal: Negative.  Negative for joint pain and myalgias.  Skin: Negative.   Neurological: Negative.  Negative for dizziness, tingling, tremors, sensory change, speech change and headaches.  Endo/Heme/Allergies: Negative.   Psychiatric/Behavioral: Negative.  Negative for depression and suicidal ideas. The patient is not nervous/anxious.         Objective   BP 122/86   Pulse 84   Ht 5' 5 (1.651 m)   Wt 188 lb 12.8 oz (85.6 kg)   SpO2 98%   BMI 31.42 kg/m   Physical Exam Vitals and nursing note reviewed.  Constitutional:      Appearance: Normal appearance.  HENT:     Head: Normocephalic and atraumatic.     Nose: Nose normal.     Mouth/Throat:     Mouth: Mucous membranes are moist.     Pharynx: Oropharynx is clear.  Eyes:     Conjunctiva/sclera: Conjunctivae normal.     Pupils: Pupils are equal, round, and reactive to light.  Cardiovascular:     Rate and Rhythm: Normal rate and regular rhythm.     Pulses: Normal pulses.     Heart sounds: Normal heart sounds. No murmur heard. Pulmonary:     Effort: Pulmonary effort is normal.     Breath sounds: Normal breath sounds. No wheezing.  Chest:  Breasts:    Right: Normal. No swelling, bleeding, inverted nipple, mass, nipple discharge, skin change or tenderness.     Left: Normal. No swelling, bleeding, inverted nipple, mass, nipple discharge, skin change or tenderness.   Abdominal:     General: Bowel sounds are normal.     Palpations:  Abdomen is soft.     Tenderness: There is no abdominal tenderness. There is no right CVA tenderness or left CVA tenderness.  Musculoskeletal:        General: Normal range of motion.     Cervical back: Normal range of motion.     Right lower leg: No edema.     Left lower leg: No edema.  Lymphadenopathy:     Upper Body:     Right upper body: No supraclavicular, axillary or pectoral adenopathy.     Left upper body: No supraclavicular, axillary or pectoral adenopathy.  Skin:    General: Skin is warm and dry.  Neurological:     General: No focal deficit present.     Mental Status: She is alert and oriented to person, place, and time.  Psychiatric:        Mood and Affect: Mood normal.        Behavior: Behavior normal.        Assessment & Plan:  Continue current medications.  Check labs.  Schedule mammogram and DEXA.  Patient will return to discuss lab results. Problem List Items Addressed This Visit     Colon polyp   Relevant Orders   CBC with Diff   H/O peptic ulcer   Relevant Orders   CBC with Diff   Vitamin D  deficiency   Relevant Orders   VITAMIN D  25 Hydroxy (Vit-D Deficiency, Fractures)   Type 2 diabetes mellitus (HCC)   Relevant Medications   FARXIGA 10 MG TABS tablet   LANTUS SOLOSTAR 100 UNIT/ML Solostar Pen   MOUNJARO 7.5 MG/0.5ML Pen   enalapril (VASOTEC) 20 MG tablet   atorvastatin  (LIPITOR) 10 MG tablet   Other Relevant Orders   CMP14+EGFR   Chronic kidney disease   Hypertension associated with diabetes (HCC) - Primary   Relevant Medications   FARXIGA 10 MG TABS tablet   LANTUS SOLOSTAR 100 UNIT/ML Solostar Pen   MOUNJARO 7.5 MG/0.5ML Pen   hydrochlorothiazide (HYDRODIURIL) 25 MG tablet   enalapril (VASOTEC) 20 MG tablet   atorvastatin  (LIPITOR) 10 MG tablet   Other Relevant Orders   CMP14+EGFR   Combined hyperlipidemia associated with type 2 diabetes mellitus (HCC)   Relevant  Medications   FARXIGA 10 MG TABS tablet   LANTUS SOLOSTAR 100 UNIT/ML Solostar Pen   MOUNJARO 7.5 MG/0.5ML Pen   hydrochlorothiazide (HYDRODIURIL) 25 MG tablet   enalapril (VASOTEC) 20 MG tablet   atorvastatin  (LIPITOR) 10 MG tablet   Other Relevant Orders   Lipid Panel w/o Chol/HDL Ratio   Other Visit Diagnoses       Nonrheumatic mitral valve regurgitation       Relevant Medications   hydrochlorothiazide (HYDRODIURIL) 25 MG tablet   enalapril (VASOTEC) 20 MG tablet   atorvastatin  (LIPITOR) 10 MG tablet   Other Relevant Orders   PCV ECHOCARDIOGRAM COMPLETE     Breast cancer screening by mammogram       Relevant Orders   MM 3D SCREENING MAMMOGRAM BILATERAL BREAST     Screening for osteoporosis       Relevant Orders   DG Bone Density     Age-related osteoporosis without current pathological fracture       Relevant Orders   DG Bone Density       Return in about 2 weeks (around 10/15/2023).   Total time spent: 30 minutes  Julie FREDY RAMAN, MD  10/01/2023   This document may have been prepared by [facility] Voice Recognition software and as such  may include unintentional dictation errors.

## 2023-10-02 LAB — CBC WITH DIFFERENTIAL/PLATELET
Basophils Absolute: 0 x10E3/uL (ref 0.0–0.2)
Basos: 0 %
EOS (ABSOLUTE): 0.1 x10E3/uL (ref 0.0–0.4)
Eos: 1 %
Hematocrit: 45.4 % (ref 34.0–46.6)
Hemoglobin: 14.6 g/dL (ref 11.1–15.9)
Immature Grans (Abs): 0 x10E3/uL (ref 0.0–0.1)
Immature Granulocytes: 0 %
Lymphocytes Absolute: 3.6 x10E3/uL — ABNORMAL HIGH (ref 0.7–3.1)
Lymphs: 40 %
MCH: 29.3 pg (ref 26.6–33.0)
MCHC: 32.2 g/dL (ref 31.5–35.7)
MCV: 91 fL (ref 79–97)
Monocytes Absolute: 0.4 x10E3/uL (ref 0.1–0.9)
Monocytes: 5 %
Neutrophils Absolute: 4.9 x10E3/uL (ref 1.4–7.0)
Neutrophils: 54 %
Platelets: 144 x10E3/uL — ABNORMAL LOW (ref 150–450)
RBC: 4.98 x10E6/uL (ref 3.77–5.28)
RDW: 12.5 % (ref 11.7–15.4)
WBC: 9.1 x10E3/uL (ref 3.4–10.8)

## 2023-10-02 LAB — CMP14+EGFR
ALT: 21 IU/L (ref 0–32)
AST: 20 IU/L (ref 0–40)
Albumin: 4.5 g/dL (ref 3.8–4.8)
Alkaline Phosphatase: 66 IU/L (ref 44–121)
BUN/Creatinine Ratio: 25 (ref 12–28)
BUN: 33 mg/dL — ABNORMAL HIGH (ref 8–27)
Bilirubin Total: 0.4 mg/dL (ref 0.0–1.2)
CO2: 23 mmol/L (ref 20–29)
Calcium: 10.2 mg/dL (ref 8.7–10.3)
Chloride: 102 mmol/L (ref 96–106)
Creatinine, Ser: 1.32 mg/dL — ABNORMAL HIGH (ref 0.57–1.00)
Globulin, Total: 3.4 g/dL (ref 1.5–4.5)
Glucose: 117 mg/dL — ABNORMAL HIGH (ref 70–99)
Potassium: 4.5 mmol/L (ref 3.5–5.2)
Sodium: 140 mmol/L (ref 134–144)
Total Protein: 7.9 g/dL (ref 6.0–8.5)
eGFR: 43 mL/min/1.73 — ABNORMAL LOW (ref >59)

## 2023-10-02 LAB — LIPID PANEL W/O CHOL/HDL RATIO
Cholesterol, Total: 143 mg/dL (ref 100–199)
HDL: 41 mg/dL (ref >39)
LDL Chol Calc (NIH): 84 mg/dL (ref 0–99)
Triglycerides: 96 mg/dL (ref 0–149)
VLDL Cholesterol Cal: 18 mg/dL (ref 5–40)

## 2023-10-02 LAB — VITAMIN D 25 HYDROXY (VIT D DEFICIENCY, FRACTURES): Vit D, 25-Hydroxy: 64.4 ng/mL (ref 30.0–100.0)

## 2023-10-18 ENCOUNTER — Ambulatory Visit: Admitting: Internal Medicine

## 2023-10-24 ENCOUNTER — Ambulatory Visit (INDEPENDENT_AMBULATORY_CARE_PROVIDER_SITE_OTHER)

## 2023-10-24 DIAGNOSIS — I361 Nonrheumatic tricuspid (valve) insufficiency: Secondary | ICD-10-CM

## 2023-10-24 DIAGNOSIS — I340 Nonrheumatic mitral (valve) insufficiency: Secondary | ICD-10-CM | POA: Diagnosis not present

## 2023-10-26 ENCOUNTER — Ambulatory Visit (INDEPENDENT_AMBULATORY_CARE_PROVIDER_SITE_OTHER): Admitting: Internal Medicine

## 2023-10-26 ENCOUNTER — Ambulatory Visit: Payer: Self-pay | Admitting: Internal Medicine

## 2023-10-26 ENCOUNTER — Encounter: Payer: Self-pay | Admitting: Internal Medicine

## 2023-10-26 VITALS — BP 130/80 | HR 59 | Ht 65.0 in | Wt 189.8 lb

## 2023-10-26 DIAGNOSIS — E785 Hyperlipidemia, unspecified: Secondary | ICD-10-CM

## 2023-10-26 DIAGNOSIS — E1169 Type 2 diabetes mellitus with other specified complication: Secondary | ICD-10-CM

## 2023-10-26 DIAGNOSIS — I152 Hypertension secondary to endocrine disorders: Secondary | ICD-10-CM | POA: Diagnosis not present

## 2023-10-26 DIAGNOSIS — E1159 Type 2 diabetes mellitus with other circulatory complications: Secondary | ICD-10-CM

## 2023-10-26 DIAGNOSIS — E782 Mixed hyperlipidemia: Secondary | ICD-10-CM | POA: Diagnosis not present

## 2023-10-26 DIAGNOSIS — N183 Chronic kidney disease, stage 3 unspecified: Secondary | ICD-10-CM | POA: Diagnosis not present

## 2023-10-26 LAB — POC CREATINE & ALBUMIN,URINE
Creatinine, POC: 200 mg/dL
Microalbumin Ur, POC: 80 mg/L

## 2023-10-26 LAB — POCT CBG (FASTING - GLUCOSE)-MANUAL ENTRY: Glucose Fasting, POC: 121 mg/dL — AB (ref 70–99)

## 2023-10-26 NOTE — Progress Notes (Signed)
 Established Patient Office Visit  Subjective:  Patient ID: Julie [name], female    DOB: 1950-09-01  Age: 73 y.o. MRN: [id number]  Chief Complaint  Patient presents with   Follow-up    2 week follow up    Patient is here to follow up on recent lab results as she just established care with us  2 weeks ago. She states she is doing well and has no complaints.  Patient reports she has not heard anything from mammogram location in [place], nor heard about getting her DEXA scheduled. Will provide patient with phone number to contact them and get her scheduled.   Patients lab results were discussed including her elevated HbgA1c that is 7% 06/2023 at endocrinology. She reports not taking her Lantus unless blood sugars are above 200 at bedtime. She reports taking on 20 units instead of 40 units. She also reports she has stopped taking novolog completely. She says her blood sugars are usually less than 150 on her CGM but does not have her monitor with her today. Will check updated HbgA1c and collect urine micro alb/creat.  Discussed echo results with patient that was completed due to murmur auscultated at new patient appointment. Echo showed mild mitral and tricuspid valve regurgitation and LVH. Denies any chest pain, shortness of breath, palpations, abdominal pain, nausea. Vomiting, diarrhea/constipation.    No other concerns at this time.   Past Medical History:  Diagnosis Date   Anemia    Arthritis    Chronic kidney disease    Diabetes mellitus without complication (HCC)    Diabetic acidosis (HCC)    Hepatitis    hepatitis c, treated   History of peptic ulcer disease    Hypertension    Microalbuminuria    Wears dentures    full upper and lower    Past Surgical History:  Procedure Laterality Date   ABDOMINAL HYSTERECTOMY  1985   CATARACT EXTRACTION W/PHACO Left 08/09/2021   Procedure: CATARACT EXTRACTION PHACO AND INTRAOCULAR LENS PLACEMENT (IOC) LEFT DIABETIC 6.88 01:11.9;   Surgeon: Mittie Gaskin, MD;  Location: [facility];  Service: Ophthalmology;  Laterality: Left;  Diabetic   CATARACT EXTRACTION W/PHACO Right 08/31/2021   Procedure: CATARACT EXTRACTION PHACO AND INTRAOCULAR LENS PLACEMENT (IOC) RIGHT DIABETIC 5.16 00:50.8;  Surgeon: Mittie Gaskin, MD;  Location: [facility];  Service: Ophthalmology;  Laterality: Right;  Diabetic   COLONOSCOPY WITH PROPOFOL  N/A 07/31/2016   Procedure: COLONOSCOPY WITH PROPOFOL ;  Surgeon: Gaylyn Gladis PENNER, MD;  Location: [facility] ENDOSCOPY;  Service: Endoscopy;  Laterality: N/A;   COLONOSCOPY WITH PROPOFOL  N/A 05/26/2021   Procedure: COLONOSCOPY WITH PROPOFOL ;  Surgeon: Therisa Bi, MD;  Location: [facility] END[facility]COPY;  Service: Gastroenterology;  Laterality: N/A;  Cell [phone])   DG  BONE DENSITY ([facility] HX)     EXCISION MASS NECK Left 12/29/2016   Procedure: CHEST WALL MASS EXCISION;  Surgeon: Dessa Reyes ORN, MD;  Location: [facility] ORS;  Service: General;  Laterality: Left;   LAPAROSCOPIC HYSTERECTOMY     assisted vaginal hysterectomy, fibroids, ovaries removed   LIVER BIOPSY  2009   + hepatitis C, referred to [facility] by Gaylyn 04/2008.   NEPHROSTOMY W/ INTRODUCTION OF CATHETER     TUBAL LIGATION      Social History   Socioeconomic History   Marital status: Married    Spouse name: Not on file   Number of children: 4   Years of education: Not on file   Highest education level: Associate degree: occupational, Scientist, product/process development, or  vocational program  Occupational History   Occupation: Driving a school bus    Comment: full time (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)  Tobacco Use   Smoking status: Former    Current packs/day: 0.00    Average packs/day: 0.8 packs/day for 36.0 years (27.0 ttl pk-yrs)    Types: Cigarettes    Start date: 02/13/1969    Quit date: 02/13/2005    Years since quitting: 18.7   Smokeless tobacco: Never  Vaping Use   Vaping status: Never Used  Substance and Sexual Activity   Alcohol use: No    Drug use: No   Sexual activity: Yes  Other Topics Concern   Not on file  Social History Narrative   Not on file   Social Drivers of Health   Financial Resource Strain: Low Risk  (05/10/2022)   Overall Financial Resource Strain (CARDIA)    Difficulty of Paying Living Expenses: Not hard at all  Food Insecurity: No Food Insecurity (05/10/2022)   Hunger Vital Sign    Worried About Running Out of Food in the Last Year: Never true    Ran Out of Food in the Last Year: Never true  Transportation Needs: No Transportation Needs (05/10/2022)   PRAPARE - Administrator, Civil Service (Medical): No    Lack of Transportation (Non-Medical): No  Physical Activity: Sufficiently Active (05/10/2022)   Exercise Vital Sign    Days of Exercise per Week: 4 days    Minutes of Exercise per Session: 40 min  Stress: No Stress Concern Present (05/10/2022)   [employer] of Occupational Health - Occupational Stress Questionnaire    Feeling of Stress : Not at all  Social Connections: Moderately Integrated (05/10/2022)   Social Connection and Isolation Panel    Frequency of Communication with Friends and Family: More than three times a week    Frequency of Social Gatherings with Friends and Family: Twice a week    Attends Religious Services: More than 4 times per year    Active Member of [employer] or Organizations: No    Attends Banker Meetings: Never    Marital Status: Married  Catering manager Violence: Not At Risk (05/10/2022)   Humiliation, Afraid, Rape, and Kick questionnaire    Fear of Current or Ex-Partner: No    Emotionally Abused: No    Physically Abused: No    Sexually Abused: No    Family History  Problem Relation Age of Onset   Arthritis Mother    Hypertension Mother    Stroke Father    Alcohol abuse Father    Cirrhosis Father    Cancer Maternal Aunt    Diabetes Son    Pancreatic disease Brother    Healthy Brother    Healthy Brother    Breast cancer Neg Hx      No Known Allergies  Outpatient Medications Prior to Visit  Medication Sig   atorvastatin  (LIPITOR) 10 MG tablet Take 1 tablet by mouth daily.   Cholecalciferol 25 MCG (1000 UT) tablet Take 5,000 Units by mouth daily.   Continuous Blood Gluc Sensor (FREESTYLE LIBRE 2 SENSOR) MISC Use 1 kit every 14 (fourteen) days for glucose monitoring   enalapril (VASOTEC) 20 MG tablet Take 40 mg by mouth at bedtime.   FARXIGA 10 MG TABS tablet Take 10 mg by mouth daily.   hydrochlorothiazide (HYDRODIURIL) 25 MG tablet Take 1 tablet by mouth daily.   MOUNJARO 7.5 MG/0.5ML Pen Inject 7.5 mg into the skin once  a week.   Continuous Blood Gluc Sensor (FREESTYLE LIBRE 14 DAY SENSOR) MISC Use 1 kit every 14 (fourteen) days (Patient not taking: Reported on 10/26/2023)   insulin aspart (NOVOLOG) 100 UNIT/ML FlexPen Inject into the skin 3 (three) times daily with meals. Takes 26 units @ breakfast, 28 units before lunch and 32 units before dinner. (Patient not taking: Reported on 10/26/2023)   LANTUS SOLOSTAR 100 UNIT/ML Solostar Pen Inject 40 Units into the skin at bedtime. (Patient not taking: Reported on 10/26/2023)   Omega-3 1000 MG CAPS Take by mouth daily. (Patient not taking: Reported on 10/26/2023)   Probiotic Product (PROBIOTIC DAILY PO) Take by mouth daily. (Patient not taking: Reported on 10/26/2023)   [DISCONTINUED] atorvastatin  (LIPITOR) 10 MG tablet Take 1 tablet by mouth once daily (Patient not taking: Reported on 10/26/2023)   [DISCONTINUED] Cinnamon 500 MG TABS Take 1,000 mg by mouth daily. (Patient not taking: Reported on 10/26/2023)   [DISCONTINUED] enalapril (VASOTEC) 20 MG tablet Take 40 mg by mouth daily.  (Patient not taking: Reported on 10/26/2023)   [DISCONTINUED] hydrochlorothiazide (HYDRODIURIL) 25 MG tablet Take by mouth. (Patient not taking: Reported on 10/26/2023)   [DISCONTINUED] insulin detemir (LEVEMIR FLEXTOUCH) 100 UNIT/ML FlexPen Inject into the skin. (Patient not taking: Reported on  10/26/2023)   [DISCONTINUED] insulin detemir (LEVEMIR) 100 UNIT/ML FlexPen Inject 52 Units into the skin at bedtime.  (Patient not taking: Reported on 10/26/2023)   [DISCONTINUED] meloxicam  (MOBIC ) 15 MG tablet Take 1 tablet (15 mg total) by mouth daily. (Patient not taking: Reported on 10/26/2023)   [DISCONTINUED] naproxen  sodium (ALEVE ) 220 MG tablet Take 220 mg by mouth daily as needed. (Patient not taking: Reported on 10/26/2023)   [DISCONTINUED] Semaglutide,0.25 or 0.5MG /DOS, 2 MG/1.5ML SOPN Inject 0.5 mg into the skin. (Patient not taking: Reported on 10/26/2023)   No facility-administered medications prior to visit.    Review of Systems  Constitutional: Negative.  Negative for chills, fever and malaise/fatigue.  HENT: Negative.  Negative for congestion and sore throat.   Eyes: Negative.  Negative for blurred vision and pain.  Respiratory: Negative.  Negative for cough and shortness of breath.   Cardiovascular: Negative.  Negative for chest pain, palpitations and leg swelling.  Gastrointestinal: Negative.  Negative for abdominal pain, blood in stool, constipation, diarrhea, heartburn, melena, nausea and vomiting.  Genitourinary: Negative.  Negative for dysuria, flank pain, frequency and urgency.  Musculoskeletal: Negative.  Negative for joint pain and myalgias.  Skin: Negative.   Neurological: Negative.  Negative for dizziness, tingling, sensory change, weakness and headaches.  Endo/Heme/Allergies: Negative.   Psychiatric/Behavioral: Negative.  Negative for depression and suicidal ideas. The patient is not nervous/anxious.        Objective:   BP 130/80   Pulse (!) 59   Ht 5' 5 (1.651 m)   Wt 189 lb 12.8 oz (86.1 kg)   SpO2 99%   BMI 31.58 kg/m   Vitals:   10/26/23 1057  BP: 130/80  Pulse: (!) 59  Height: 5' 5 (1.651 m)  Weight: 189 lb 12.8 oz (86.1 kg)  SpO2: 99%  BMI (Calculated): 31.58    Physical Exam Vitals and nursing note reviewed.  Constitutional:       Appearance: Normal appearance.  HENT:     Head: Normocephalic and atraumatic.     Nose: Nose normal.     Mouth/Throat:     Mouth: Mucous membranes are moist.     Pharynx: Oropharynx is clear.  Eyes:     Conjunctiva/sclera: Conjunctivae  normal.     Pupils: Pupils are equal, round, and reactive to light.  Cardiovascular:     Rate and Rhythm: Normal rate and regular rhythm.     Pulses: Normal pulses.     Heart sounds: Normal heart sounds. No murmur heard. Pulmonary:     Effort: Pulmonary effort is normal.     Breath sounds: Normal breath sounds. No wheezing.  Abdominal:     General: Bowel sounds are normal.     Palpations: Abdomen is soft.     Tenderness: There is no abdominal tenderness. There is no right CVA tenderness or left CVA tenderness.  Musculoskeletal:        General: Normal range of motion.     Cervical back: Normal range of motion.     Right lower leg: No edema.     Left lower leg: No edema.  Skin:    General: Skin is warm and dry.  Neurological:     General: No focal deficit present.     Mental Status: She is alert and oriented to person, place, and time.  Psychiatric:        Mood and Affect: Mood normal.        Behavior: Behavior normal.      Results for orders placed or performed in visit on 10/26/23  POCT CBG (Fasting - Glucose)  Result Value Ref Range   Glucose Fasting, POC 121 (A) 70 - 99 mg/dL  POC CREATINE & ALBUMIN,URINE  Result Value Ref Range   Microalbumin Ur, POC 80 mg/L   Creatinine, POC 200 mg/dL   Albumin/Creatinine Ratio, Urine, POC 30-300     Recent Results (from the past 2160 hours)  POCT CBG (Fasting - Glucose)     Status: Abnormal   Collection Time: 10/01/23 10:20 AM  Result Value Ref Range   Glucose Fasting, POC 116 (A) 70 - 99 mg/dL  Lipid Panel w/o Chol/HDL Ratio     Status: None   Collection Time: 10/01/23 11:19 AM  Result Value Ref Range   Cholesterol, Total 143 100 - 199 mg/dL   Triglycerides 96 0 - 149 mg/dL   HDL 41 >60  mg/dL   VLDL Cholesterol Cal 18 5 - 40 mg/dL   LDL Chol Calc (NIH) 84 0 - 99 mg/dL  [id number]     Status: Abnormal   Collection Time: 10/01/23 11:19 AM  Result Value Ref Range   Glucose 117 (H) 70 - 99 mg/dL   BUN 33 (H) 8 - 27 mg/dL   Creatinine, Ser 8.67 (H) 0.57 - 1.00 mg/dL   eGFR 43 (L) >40 fO/fpw/8.26   BUN/Creatinine Ratio 25 12 - 28   Sodium 140 134 - 144 mmol/L   Potassium 4.5 3.5 - 5.2 mmol/L   Chloride 102 96 - 106 mmol/L   CO2 23 20 - 29 mmol/L   Calcium  10.2 8.7 - 10.3 mg/dL   Total Protein 7.9 6.0 - 8.5 g/dL   Albumin 4.5 3.8 - 4.8 g/dL   Globulin, Total 3.4 1.5 - 4.5 g/dL   Bilirubin Total 0.4 0.0 - 1.2 mg/dL   Alkaline Phosphatase 66 44 - 121 IU/L   AST 20 0 - 40 IU/L   ALT 21 0 - 32 IU/L  CBC with Diff     Status: Abnormal   Collection Time: 10/01/23 11:19 AM  Result Value Ref Range   WBC 9.1 3.4 - 10.8 x10E3/uL   RBC 4.98 3.77 - 5.28 x10E6/uL   Hemoglobin 14.6 11.1 -  15.9 g/dL   Hematocrit 54.5 65.9 - 46.6 %   MCV 91 79 - 97 fL   MCH 29.3 26.6 - 33.0 pg   MCHC 32.2 31.5 - 35.7 g/dL   RDW 87.4 88.2 - 84.5 %   Platelets 144 (L) 150 - 450 x10E3/uL   Neutrophils 54 Not Estab. %   Lymphs 40 Not Estab. %   Monocytes 5 Not Estab. %   Eos 1 Not Estab. %   Basos 0 Not Estab. %   Neutrophils Absolute 4.9 1.4 - 7.0 x10E3/uL   Lymphocytes Absolute 3.6 (H) 0.7 - 3.1 x10E3/uL   Monocytes Absolute 0.4 0.1 - 0.9 x10E3/uL   EOS (ABSOLUTE) 0.1 0.0 - 0.4 x10E3/uL   Basophils Absolute 0.0 0.0 - 0.2 x10E3/uL   Immature Granulocytes 0 Not Estab. %   Immature Grans (Abs) 0.0 0.0 - 0.1 x10E3/uL  VITAMIN D  25 Hydroxy (Vit-D Deficiency, Fractures)     Status: None   Collection Time: 10/01/23 11:19 AM  Result Value Ref Range   Vit D, 25-Hydroxy 64.4 30.0 - 100.0 ng/mL    Comment: Vitamin D  deficiency has been defined by the Institute of Medicine and an Endocrine Society practice guideline as a level of serum 25-OH vitamin D  less than 20 ng/mL (1,2). The Endocrine Society  went on to further define vitamin D  insufficiency as a level between 21 and 29 ng/mL (2). 1. IOM (Institute of Medicine). 2010. Dietary reference    intakes for calcium  and D. [place]: The    [employer]. 2. Holick MF, Binkley Sims, Bischoff-Ferrari HA, et al.    Evaluation, treatment, and prevention of vitamin D     deficiency: an Endocrine Society clinical practice    guideline. JCEM. 2011 Jul; 96(7):1911-30.   POCT CBG (Fasting - Glucose)     Status: Abnormal   Collection Time: 10/26/23 11:02 AM  Result Value Ref Range   Glucose Fasting, POC 121 (A) 70 - 99 mg/dL  POC CREATINE & ALBUMIN,URINE     Status: Abnormal   Collection Time: 10/26/23 11:26 AM  Result Value Ref Range   Microalbumin Ur, POC 80 mg/L   Creatinine, POC 200 mg/dL   Albumin/Creatinine Ratio, Urine, POC 30-300       Assessment & Plan:  Continue medications as currently taking. Will check HbgA1c and urine micro alb/creat today and follow up with patient on results and any needed changes to medications at that time. Encouraged patient to eat strict diet low in carbohydrates and concentrated sweets and exercise as tolerated. Problem List Items Addressed This Visit     Chronic kidney disease   Hypertension associated with diabetes (HCC)   Combined hyperlipidemia associated with type 2 diabetes mellitus (HCC)   Type 2 diabetes mellitus with hyperlipidemia (HCC) - Primary   Relevant Orders   POCT CBG (Fasting - Glucose) (Completed)   Hemoglobin A1c   POC CREATINE & ALBUMIN,URINE (Completed)    Return in about 3 months (around 01/25/2024).   Total time spent: 20 minutes  FERNAND FREDY RAMAN, MD  10/26/2023   This document may have been prepared by [facility] Voice Recognition software and as such may include unintentional dictation errors.

## 2023-10-27 LAB — HEMOGLOBIN A1C
Est. average glucose Bld gHb Est-mCnc: 169 mg/dL
Hgb A1c MFr Bld: 7.5 % — ABNORMAL HIGH (ref 4.8–5.6)

## 2023-11-02 NOTE — Progress Notes (Signed)
 Patient notified

## 2023-11-15 DIAGNOSIS — J069 Acute upper respiratory infection, unspecified: Secondary | ICD-10-CM | POA: Diagnosis not present

## 2023-11-15 DIAGNOSIS — R059 Cough, unspecified: Secondary | ICD-10-CM | POA: Diagnosis not present

## 2023-11-20 DIAGNOSIS — Z961 Presence of intraocular lens: Secondary | ICD-10-CM | POA: Diagnosis not present

## 2023-11-20 DIAGNOSIS — H40003 Preglaucoma, unspecified, bilateral: Secondary | ICD-10-CM | POA: Diagnosis not present

## 2023-11-20 DIAGNOSIS — H26491 Other secondary cataract, right eye: Secondary | ICD-10-CM | POA: Diagnosis not present

## 2023-11-28 ENCOUNTER — Ambulatory Visit
Admission: RE | Admit: 2023-11-28 | Discharge: 2023-11-28 | Disposition: A | Discharge status: Home / Self Care | Source: Ambulatory Visit | Attending: Internal Medicine | Admitting: Internal Medicine

## 2023-11-28 DIAGNOSIS — M810 Age-related osteoporosis without current pathological fracture: Secondary | ICD-10-CM | POA: Insufficient documentation

## 2023-11-28 DIAGNOSIS — Z1231 Encounter for screening mammogram for malignant neoplasm of breast: Secondary | ICD-10-CM | POA: Insufficient documentation

## 2023-11-28 DIAGNOSIS — Z780 Asymptomatic menopausal state: Secondary | ICD-10-CM | POA: Diagnosis not present

## 2023-11-28 DIAGNOSIS — Z13820 Encounter for screening for osteoporosis: Secondary | ICD-10-CM | POA: Diagnosis not present

## 2023-11-28 DIAGNOSIS — M85852 Other specified disorders of bone density and structure, left thigh: Secondary | ICD-10-CM | POA: Diagnosis not present

## 2023-12-04 NOTE — Progress Notes (Signed)
 Patient notified

## 2023-12-26 DIAGNOSIS — Z794 Long term (current) use of insulin: Secondary | ICD-10-CM | POA: Diagnosis not present

## 2023-12-26 DIAGNOSIS — R809 Proteinuria, unspecified: Secondary | ICD-10-CM | POA: Diagnosis not present

## 2023-12-26 DIAGNOSIS — E1129 Type 2 diabetes mellitus with other diabetic kidney complication: Secondary | ICD-10-CM | POA: Diagnosis not present

## 2024-01-03 DIAGNOSIS — E1159 Type 2 diabetes mellitus with other circulatory complications: Secondary | ICD-10-CM | POA: Diagnosis not present

## 2024-01-03 DIAGNOSIS — I10 Essential (primary) hypertension: Secondary | ICD-10-CM | POA: Diagnosis not present

## 2024-01-03 DIAGNOSIS — E1169 Type 2 diabetes mellitus with other specified complication: Secondary | ICD-10-CM | POA: Diagnosis not present

## 2024-01-03 DIAGNOSIS — E1129 Type 2 diabetes mellitus with other diabetic kidney complication: Secondary | ICD-10-CM | POA: Diagnosis not present

## 2024-01-03 DIAGNOSIS — E785 Hyperlipidemia, unspecified: Secondary | ICD-10-CM | POA: Diagnosis not present

## 2024-01-03 DIAGNOSIS — E1122 Type 2 diabetes mellitus with diabetic chronic kidney disease: Secondary | ICD-10-CM | POA: Diagnosis not present

## 2024-01-03 DIAGNOSIS — Z794 Long term (current) use of insulin: Secondary | ICD-10-CM | POA: Diagnosis not present

## 2024-01-03 DIAGNOSIS — R809 Proteinuria, unspecified: Secondary | ICD-10-CM | POA: Diagnosis not present

## 2024-01-03 DIAGNOSIS — N1832 Chronic kidney disease, stage 3b: Secondary | ICD-10-CM | POA: Diagnosis not present

## 2024-01-25 ENCOUNTER — Ambulatory Visit: Payer: Self-pay | Admitting: Internal Medicine

## 2024-01-25 ENCOUNTER — Encounter: Payer: Self-pay | Admitting: Internal Medicine

## 2024-01-25 ENCOUNTER — Ambulatory Visit (INDEPENDENT_AMBULATORY_CARE_PROVIDER_SITE_OTHER): Admitting: Internal Medicine

## 2024-01-25 VITALS — BP 108/72 | HR 90 | Ht 65.0 in | Wt 193.8 lb

## 2024-01-25 DIAGNOSIS — E1169 Type 2 diabetes mellitus with other specified complication: Secondary | ICD-10-CM

## 2024-01-25 DIAGNOSIS — I152 Hypertension secondary to endocrine disorders: Secondary | ICD-10-CM | POA: Diagnosis not present

## 2024-01-25 DIAGNOSIS — E559 Vitamin D deficiency, unspecified: Secondary | ICD-10-CM | POA: Diagnosis not present

## 2024-01-25 DIAGNOSIS — E782 Mixed hyperlipidemia: Secondary | ICD-10-CM

## 2024-01-25 DIAGNOSIS — N1832 Chronic kidney disease, stage 3b: Secondary | ICD-10-CM

## 2024-01-25 DIAGNOSIS — Z6832 Body mass index (BMI) 32.0-32.9, adult: Secondary | ICD-10-CM | POA: Diagnosis not present

## 2024-01-25 DIAGNOSIS — E785 Hyperlipidemia, unspecified: Secondary | ICD-10-CM | POA: Diagnosis not present

## 2024-01-25 DIAGNOSIS — E66811 Obesity, class 1: Secondary | ICD-10-CM | POA: Diagnosis not present

## 2024-01-25 DIAGNOSIS — E6609 Other obesity due to excess calories: Secondary | ICD-10-CM

## 2024-01-25 DIAGNOSIS — Z0001 Encounter for general adult medical examination with abnormal findings: Secondary | ICD-10-CM | POA: Diagnosis not present

## 2024-01-25 DIAGNOSIS — Z0000 Encounter for general adult medical examination without abnormal findings: Secondary | ICD-10-CM | POA: Insufficient documentation

## 2024-01-25 DIAGNOSIS — E538 Deficiency of other specified B group vitamins: Secondary | ICD-10-CM | POA: Diagnosis not present

## 2024-01-25 DIAGNOSIS — E1159 Type 2 diabetes mellitus with other circulatory complications: Secondary | ICD-10-CM | POA: Diagnosis not present

## 2024-01-25 LAB — POCT CBG (FASTING - GLUCOSE)-MANUAL ENTRY: Glucose Fasting, POC: 124 mg/dL — AB (ref 70–99)

## 2024-01-25 NOTE — Progress Notes (Signed)
 Established Patient Office Visit  Subjective:  Patient ID: Julie [name], female    DOB: 1950/06/21  Age: 73 y.o. MRN: [id number]  Chief Complaint  Patient presents with   Follow-up    3 month follow up    Patient is here today for Medicare AWV. She reports feeling well today and has no new complaints to discuss.  She sees endocrinology and her HbgA1c was last checked 12/2023 7.1% which had improved. Her endocrinologist increased Mounjaro to 10 mg weekly injection. She denies any abdominal distress from her medication.  She is due for other routine blood work today. Patient average fasting blood sugar reading is 130's. She has not been exercising as much as she usually does. She will be more mindful and increase her activity.   Last colonoscopy was 2023. Mammogram and DEXA scan 11/2023. PHQ-9 score and GAD-7 score; ^CIT score 0 today.     No other concerns at this time.   Past Medical History:  Diagnosis Date   Anemia    Arthritis    Chronic kidney disease    Diabetes mellitus without complication (HCC)    Diabetic acidosis (HCC)    Hepatitis    hepatitis c, treated   History of peptic ulcer disease    Hypertension    Microalbuminuria    Wears dentures    full upper and lower    Past Surgical History:  Procedure Laterality Date   ABDOMINAL HYSTERECTOMY  1985   CATARACT EXTRACTION W/PHACO Left 08/09/2021   Procedure: CATARACT EXTRACTION PHACO AND INTRAOCULAR LENS PLACEMENT (IOC) LEFT DIABETIC 6.88 01:11.9;  Surgeon: Mittie Gaskin, MD;  Location: [facility];  Service: Ophthalmology;  Laterality: Left;  Diabetic   CATARACT EXTRACTION W/PHACO Right 08/31/2021   Procedure: CATARACT EXTRACTION PHACO AND INTRAOCULAR LENS PLACEMENT (IOC) RIGHT DIABETIC 5.16 00:50.8;  Surgeon: Mittie Gaskin, MD;  Location: [facility];  Service: Ophthalmology;  Laterality: Right;  Diabetic   COLONOSCOPY WITH PROPOFOL  N/A 07/31/2016   Procedure: COLONOSCOPY  WITH PROPOFOL ;  Surgeon: Gaylyn Gladis PENNER, MD;  Location: [facility];  Service: Endoscopy;  Laterality: N/A;   COLONOSCOPY WITH PROPOFOL  N/A 05/26/2021   Procedure: COLONOSCOPY WITH PROPOFOL ;  Surgeon: Therisa Bi, MD;  Location: [facility] END[facility]COPY;  Service: Gastroenterology;  Laterality: N/A;  Cell [phone])   DG  BONE DENSITY ([facility] HX)     EXCISION MASS NECK Left 12/29/2016   Procedure: CHEST WALL MASS EXCISION;  Surgeon: Dessa Reyes ORN, MD;  Location: [facility] ORS;  Service: General;  Laterality: Left;   LAPAROSCOPIC HYSTERECTOMY     assisted vaginal hysterectomy, fibroids, ovaries removed   LIVER BIOPSY  2009   + hepatitis C, referred to [facility] by Gaylyn 04/2008.   NEPHROSTOMY W/ INTRODUCTION OF CATHETER     TUBAL LIGATION      Social History   Socioeconomic History   Marital status: Married    Spouse name: Not on file   Number of children: 4   Years of education: Not on file   Highest education level: Associate degree: occupational, scientist, product/process development, or vocational program  Occupational History   Occupation: Driving a school bus    Comment: full time (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)  Tobacco Use   Smoking status: Former    Current packs/day: 0.00    Average packs/day: 0.8 packs/day for 36.0 years (27.0 ttl pk-yrs)    Types: Cigarettes    Start date: 02/13/1969    Quit date: 02/13/2005    Years since quitting:  18.9   Smokeless tobacco: Never  Vaping Use   Vaping status: Never Used  Substance and Sexual Activity   Alcohol use: No   Drug use: No   Sexual activity: Yes  Other Topics Concern   Not on file  Social History Narrative   Not on file   Social Drivers of Health   Tobacco Use: Medium Risk (01/25/2024)   Patient History    Smoking Tobacco Use: Former    Smokeless Tobacco Use: Never    Passive Exposure: Not on file  Financial Resource Strain: Low Risk (05/10/2022)   Overall Financial Resource Strain (CARDIA)    Difficulty of Paying Living Expenses: Not hard at all   Food Insecurity: No Food Insecurity (05/10/2022)   Hunger Vital Sign    Worried About Running Out of Food in the Last Year: Never true    Ran Out of Food in the Last Year: Never true  Transportation Needs: No Transportation Needs (05/10/2022)   PRAPARE - Administrator, Civil Service (Medical): No    Lack of Transportation (Non-Medical): No  Physical Activity: Sufficiently Active (05/10/2022)   Exercise Vital Sign    Days of Exercise per Week: 4 days    Minutes of Exercise per Session: 40 min  Stress: No Stress Concern Present (05/10/2022)   [employer] of Occupational Health - Occupational Stress Questionnaire    Feeling of Stress : Not at all  Social Connections: Moderately Integrated (05/10/2022)   Social Connection and Isolation Panel    Frequency of Communication with Friends and Family: More than three times a week    Frequency of Social Gatherings with Friends and Family: Twice a week    Attends Religious Services: More than 4 times per year    Active Member of [employer] or Organizations: No    Attends Banker Meetings: Never    Marital Status: Married  Catering Manager Violence: Not At Risk (05/10/2022)   Humiliation, Afraid, Rape, and Kick questionnaire    Fear of Current or Ex-Partner: No    Emotionally Abused: No    Physically Abused: No    Sexually Abused: No  Depression (PHQ2-9): Low Risk (10/01/2023)   Depression (PHQ2-9)    PHQ-2 Score: 1  Alcohol Screen: Low Risk (05/10/2022)   Alcohol Screen    Last Alcohol Screening Score (AUDIT): 0  Housing: Unknown (06/27/2023)   Received from [facility]   Epic    Unable to Pay for Housing in the Last Year: Not on file    Number of Times Moved in the Last Year: Not on file    At any time in the past 12 months, were you homeless or living in a shelter (including now)?: No  Utilities: Not At Risk (05/10/2022)   [facility] Utilities    Threatened with loss of utilities: No  Health Literacy:  Not on file    Family History  Problem Relation Age of Onset   Arthritis Mother    Hypertension Mother    Stroke Father    Alcohol abuse Father    Cirrhosis Father    Cancer Maternal Aunt    Diabetes Son    Pancreatic disease Brother    Healthy Brother    Healthy Brother    Breast cancer Neg Hx     Allergies[1]  Show/hide medication list[2]  Review of Systems  Constitutional: Negative.  Negative for chills, fever and malaise/fatigue.  HENT: Negative.  Negative for congestion and sore throat.  Eyes: Negative.  Negative for blurred vision and pain.  Respiratory: Negative.  Negative for cough and shortness of breath.   Cardiovascular: Negative.  Negative for chest pain, palpitations and leg swelling.  Gastrointestinal: Negative.  Negative for abdominal pain, blood in stool, constipation, diarrhea, heartburn, melena, nausea and vomiting.  Genitourinary: Negative.  Negative for dysuria, flank pain, frequency and urgency.  Musculoskeletal: Negative.  Negative for joint pain and myalgias.  Skin: Negative.   Neurological: Negative.  Negative for dizziness, tingling, sensory change, weakness and headaches.  Endo/Heme/Allergies: Negative.   Psychiatric/Behavioral: Negative.  Negative for depression and suicidal ideas. The patient is not nervous/anxious.        Objective:   BP 108/72   Pulse 90   Ht 5' 5 (1.651 m)   Wt 193 lb 12.8 oz (87.9 kg)   SpO2 97%   BMI 32.25 kg/m   Vitals:   01/25/24 1107  BP: 108/72  Pulse: 90  Height: 5' 5 (1.651 m)  Weight: 193 lb 12.8 oz (87.9 kg)  SpO2: 97%  BMI (Calculated): 32.25    Physical Exam Vitals and nursing note reviewed.  Constitutional:      Appearance: Normal appearance.  HENT:     Head: Normocephalic and atraumatic.     Nose: Nose normal.     Mouth/Throat:     Mouth: Mucous membranes are moist.     Pharynx: Oropharynx is clear.  Eyes:     Conjunctiva/sclera: Conjunctivae normal.     Pupils: Pupils are equal,  round, and reactive to light.  Cardiovascular:     Rate and Rhythm: Normal rate and regular rhythm.     Pulses: Normal pulses.     Heart sounds: Normal heart sounds. No murmur heard. Pulmonary:     Effort: Pulmonary effort is normal.     Breath sounds: Normal breath sounds. No wheezing.  Abdominal:     General: Bowel sounds are normal.     Palpations: Abdomen is soft.     Tenderness: There is no abdominal tenderness. There is no right CVA tenderness or left CVA tenderness.  Musculoskeletal:        General: Normal range of motion.     Cervical back: Normal range of motion.     Right lower leg: No edema.     Left lower leg: No edema.  Skin:    General: Skin is warm and dry.  Neurological:     General: No focal deficit present.     Mental Status: She is alert and oriented to person, place, and time.  Psychiatric:        Mood and Affect: Mood normal.        Behavior: Behavior normal.      Results for orders placed or performed in visit on 01/25/24  POCT CBG (Fasting - Glucose)  Result Value Ref Range   Glucose Fasting, POC 124 (A) 70 - 99 mg/dL    Recent Results (from the past 2160 hours)  POCT CBG (Fasting - Glucose)     Status: Abnormal   Collection Time: 01/25/24 11:14 AM  Result Value Ref Range   Glucose Fasting, POC 124 (A) 70 - 99 mg/dL      Assessment & Plan:  Continue medications as prescribed. Reinforced healthy diet and exercise as tolerated. Keep specialists appointments as scheduled. Routine blood work today and FU with patient on results. Problem List Items Addressed This Visit       Cardiovascular and Mediastinum   Hypertension associated with  diabetes (HCC)   Relevant Medications   MOUNJARO 10 MG/0.5ML Pen   Other Relevant Orders   CBC with Diff     Endocrine   Combined hyperlipidemia associated with type 2 diabetes mellitus (HCC)   Relevant Medications   MOUNJARO 10 MG/0.5ML Pen   Other Relevant Orders   Lipid Panel w/o Chol/HDL Ratio   Type 2  diabetes mellitus with hyperlipidemia (HCC)   Relevant Medications   MOUNJARO 10 MG/0.5ML Pen   Other Relevant Orders   POCT CBG (Fasting - Glucose) (Completed)     Genitourinary   Chronic kidney disease     Other   Vitamin D  deficiency   Relevant Orders   Vitamin D  (25 hydroxy)   B12 deficiency   Relevant Orders   Vitamin B12   Class 1 obesity due to excess calories with serious comorbidity and body mass index (BMI) of 32.0 to 32.9 in adult   Relevant Medications   MOUNJARO 10 MG/0.5ML Pen   Medicare annual wellness visit, subsequent - Primary    Return in about 3 months (around 04/24/2024).   Total time spent: 30 minutes. This time includes review of previous notes and results and patient face to face interaction during today's visit.    FERNAND FREDY RAMAN, MD  01/25/2024   This document may have been prepared by [facility] Voice Recognition software and as such may include unintentional dictation errors.     [1] No Known Allergies [2]  Outpatient Medications Prior to Visit  Medication Sig   atorvastatin  (LIPITOR) 10 MG tablet Take 1 tablet by mouth daily.   Cholecalciferol 25 MCG (1000 UT) tablet Take 5,000 Units by mouth daily.   Continuous Blood Gluc Sensor (FREESTYLE LIBRE 2 SENSOR) MISC Use 1 kit every 14 (fourteen) days for glucose monitoring   enalapril (VASOTEC) 20 MG tablet Take 40 mg by mouth at bedtime.   FARXIGA 10 MG TABS tablet Take 10 mg by mouth daily.   hydrochlorothiazide (HYDRODIURIL) 25 MG tablet Take 1 tablet by mouth daily.   insulin aspart (NOVOLOG) 100 UNIT/ML FlexPen Inject into the skin 3 (three) times daily with meals. Takes 26 units @ breakfast, 28 units before lunch and 32 units before dinner.   LANTUS SOLOSTAR 100 UNIT/ML Solostar Pen Inject 40 Units into the skin at bedtime.   MOUNJARO 10 MG/0.5ML Pen Inject 10 mg into the skin once a week.   Continuous Blood Gluc Sensor (FREESTYLE LIBRE 14 DAY SENSOR) MISC Use 1 kit every 14 (fourteen) days  (Patient not taking: Reported on 01/25/2024)   [DISCONTINUED] MOUNJARO 7.5 MG/0.5ML Pen Inject 7.5 mg into the skin once a week. (Patient not taking: Reported on 01/25/2024)   [DISCONTINUED] Omega-3 1000 MG CAPS Take by mouth daily. (Patient not taking: Reported on 01/25/2024)   [DISCONTINUED] Probiotic Product (PROBIOTIC DAILY PO) Take by mouth daily. (Patient not taking: Reported on 01/25/2024)   No facility-administered medications prior to visit.

## 2024-01-26 LAB — CBC WITH DIFFERENTIAL/PLATELET
Basophils Absolute: 0 x10E3/uL (ref 0.0–0.2)
Basos: 0 %
EOS (ABSOLUTE): 0.1 x10E3/uL (ref 0.0–0.4)
Eos: 1 %
Hematocrit: 48.2 % — ABNORMAL HIGH (ref 34.0–46.6)
Hemoglobin: 15.1 g/dL (ref 11.1–15.9)
Immature Grans (Abs): 0 x10E3/uL (ref 0.0–0.1)
Immature Granulocytes: 0 %
Lymphocytes Absolute: 3.7 x10E3/uL — ABNORMAL HIGH (ref 0.7–3.1)
Lymphs: 35 %
MCH: 29 pg (ref 26.6–33.0)
MCHC: 31.3 g/dL — ABNORMAL LOW (ref 31.5–35.7)
MCV: 93 fL (ref 79–97)
Monocytes Absolute: 0.6 x10E3/uL (ref 0.1–0.9)
Monocytes: 6 %
Neutrophils Absolute: 6 x10E3/uL (ref 1.4–7.0)
Neutrophils: 58 %
Platelets: 154 x10E3/uL (ref 150–450)
RBC: 5.21 x10E6/uL (ref 3.77–5.28)
RDW: 12.9 % (ref 11.7–15.4)
WBC: 10.5 x10E3/uL (ref 3.4–10.8)

## 2024-01-26 LAB — LIPID PANEL W/O CHOL/HDL RATIO
Cholesterol, Total: 148 mg/dL (ref 100–199)
HDL: 47 mg/dL (ref >39)
LDL Chol Calc (NIH): 77 mg/dL (ref 0–99)
Triglycerides: 135 mg/dL (ref 0–149)
VLDL Cholesterol Cal: 24 mg/dL (ref 5–40)

## 2024-01-26 LAB — VITAMIN D 25 HYDROXY (VIT D DEFICIENCY, FRACTURES): Vit D, 25-Hydroxy: 84.5 ng/mL (ref 30.0–100.0)

## 2024-01-26 LAB — VITAMIN B12: Vitamin B-12: 575 pg/mL (ref 232–1245)

## 2024-01-28 NOTE — Progress Notes (Signed)
 Patient notified

## 2024-04-24 ENCOUNTER — Ambulatory Visit: Admitting: Internal Medicine
